# Patient Record
Sex: Male | Born: 1945
Health system: Southern US, Community
[De-identification: ages and names within clinical notes are randomized; demographics above are authoritative.]

## PROBLEM LIST (undated history)

## (undated) DIAGNOSIS — I10 Essential (primary) hypertension: Secondary | ICD-10-CM

## (undated) DIAGNOSIS — F419 Anxiety disorder, unspecified: Secondary | ICD-10-CM

## (undated) DIAGNOSIS — M1990 Unspecified osteoarthritis, unspecified site: Secondary | ICD-10-CM

## (undated) DIAGNOSIS — R7303 Prediabetes: Secondary | ICD-10-CM

## (undated) DIAGNOSIS — Z9289 Personal history of other medical treatment: Secondary | ICD-10-CM

## (undated) DIAGNOSIS — R011 Cardiac murmur, unspecified: Secondary | ICD-10-CM

## (undated) DIAGNOSIS — I2510 Atherosclerotic heart disease of native coronary artery without angina pectoris: Secondary | ICD-10-CM

## (undated) DIAGNOSIS — E785 Hyperlipidemia, unspecified: Secondary | ICD-10-CM

## (undated) DIAGNOSIS — C801 Malignant (primary) neoplasm, unspecified: Secondary | ICD-10-CM

## (undated) DIAGNOSIS — T8859XA Other complications of anesthesia, initial encounter: Secondary | ICD-10-CM

## (undated) DIAGNOSIS — D649 Anemia, unspecified: Secondary | ICD-10-CM

## (undated) HISTORY — PX: WISDOM TOOTH EXTRACTION: SHX21

---

## 2000-11-25 ENCOUNTER — Encounter (INDEPENDENT_AMBULATORY_CARE_PROVIDER_SITE_OTHER): Payer: Self-pay | Admitting: *Deleted

## 2000-11-25 ENCOUNTER — Ambulatory Visit (HOSPITAL_COMMUNITY): Admission: RE | Admit: 2000-11-25 | Discharge: 2000-11-25 | Payer: Self-pay | Admitting: Gastroenterology

## 2004-06-04 ENCOUNTER — Ambulatory Visit: Payer: Self-pay | Admitting: Hematology and Oncology

## 2008-10-24 ENCOUNTER — Encounter: Admission: RE | Admit: 2008-10-24 | Discharge: 2008-10-24 | Payer: Self-pay | Admitting: Family Medicine

## 2010-08-21 NOTE — Procedures (Signed)
[facility]  Patient:    Randy [name], Randy [name] Visit Number: [id number] MRN: [id number]          Service Type: END Location: ENDO Attending Physician:  Nelda Marseille Proc. Date: 11/25/00 Adm. Date:  11/25/2000   CC:         Dellis Anes. Idell Pickles, M.D.   Procedure Report  PROCEDURE:  Colonoscopy with polypectomy.  INDICATIONS:  Family history of colon cancer, due for colonic screening.  INFORMED CONSENT:  Consent was signed after risk, benefits, methods and options were thoroughly discussed in the office.  MEDICINES USED:  Demerol 70 mg, Versed 8 mg.  DESCRIPTION OF PROCEDURE:  Rectal inspection was pertinent for external hemorrhoids.  Digital exam was negative.  The video colonoscope was inserted and easily advanced around the colon to the cecum.  On insertion in the distal transverse, a small polyp was seen and was hot biopsied x 1.  No other abnormalities were seen on insertion.  To advance to the cecum required some minimal abdominal pressure but no position changes.  The cecum was identified by the appendiceal orifice and the ileocecal valve.  Prep was adequate.  There was some liquid stool that required washing and suctioning.  On slow withdrawal through the colon, the cecum, ascending, and majority of the transverse was normal.  Withdrew back to the polypectomy site which had a nice white coagulum and no obvious residual polypoid tissue.  The scope was slowly withdrawn.  There were a few very early left-sided diverticula but no other abnormalities seen as we slowly withdrew back to the rectum.  Once back in the rectum, the scope was retroflexed, pertinent for some tiny internal hemorrhoids.  The scope was straightened and readvanced a short ways up the sigmoid, air was suctioned and the scope removed.  The patient tolerated the procedure well, and there was no obvious immediate complication.  ENDOSCOPIC DIAGNOSES: 1. Internal and external  hemorrhoids. 2. A few very early left-sided diverticula. 3. Tiny distal transverse polyp, hot biopsied. 4. Otherwise within normal limits to the cecum.  PLAN:  Await pathology.  Probably repeat colonoscopy in 3-5 years if this is adenomatous.  Yearly rectals and guaiacs per Dr. Idell Pickles, and I would be happy see back p.r.n.  Otherwise no aspirin or nonsteroidals for one week and Tylenol only. Attending Physician:  Nelda Marseille DD:  11/25/00 TD:  11/26/00 Job: [id number]

## 2011-11-04 HISTORY — PX: CORONARY ANGIOPLASTY WITH STENT PLACEMENT: SHX49

## 2011-12-02 ENCOUNTER — Emergency Department (HOSPITAL_COMMUNITY): Payer: BC Managed Care – PPO

## 2011-12-02 ENCOUNTER — Other Ambulatory Visit: Payer: Self-pay

## 2011-12-02 ENCOUNTER — Encounter (HOSPITAL_COMMUNITY): Payer: Self-pay | Admitting: Emergency Medicine

## 2011-12-02 ENCOUNTER — Encounter (HOSPITAL_COMMUNITY)
Admission: EM | Disposition: A | Discharge status: Home / Self Care | Payer: Self-pay | Source: Home / Self Care | Attending: Internal Medicine

## 2011-12-02 ENCOUNTER — Inpatient Hospital Stay (HOSPITAL_COMMUNITY)
Admission: EM | Admit: 2011-12-02 | Discharge: 2011-12-03 | DRG: 854 | Disposition: A | Discharge status: Home / Self Care | Payer: BC Managed Care – PPO | Attending: Internal Medicine | Admitting: Internal Medicine

## 2011-12-02 ENCOUNTER — Ambulatory Visit (HOSPITAL_COMMUNITY): Admit: 2011-12-02 | Payer: Self-pay | Admitting: Internal Medicine

## 2011-12-02 DIAGNOSIS — I10 Essential (primary) hypertension: Secondary | ICD-10-CM | POA: Diagnosis present

## 2011-12-02 DIAGNOSIS — R072 Precordial pain: Secondary | ICD-10-CM

## 2011-12-02 DIAGNOSIS — Z955 Presence of coronary angioplasty implant and graft: Secondary | ICD-10-CM

## 2011-12-02 DIAGNOSIS — I2510 Atherosclerotic heart disease of native coronary artery without angina pectoris: Principal | ICD-10-CM | POA: Diagnosis present

## 2011-12-02 DIAGNOSIS — I25810 Atherosclerosis of coronary artery bypass graft(s) without angina pectoris: Secondary | ICD-10-CM

## 2011-12-02 DIAGNOSIS — I200 Unstable angina: Secondary | ICD-10-CM | POA: Diagnosis present

## 2011-12-02 DIAGNOSIS — Z7982 Long term (current) use of aspirin: Secondary | ICD-10-CM

## 2011-12-02 DIAGNOSIS — I4949 Other premature depolarization: Secondary | ICD-10-CM

## 2011-12-02 DIAGNOSIS — Z79899 Other long term (current) drug therapy: Secondary | ICD-10-CM

## 2011-12-02 DIAGNOSIS — E785 Hyperlipidemia, unspecified: Secondary | ICD-10-CM | POA: Diagnosis present

## 2011-12-02 DIAGNOSIS — R0789 Other chest pain: Secondary | ICD-10-CM

## 2011-12-02 DIAGNOSIS — I493 Ventricular premature depolarization: Secondary | ICD-10-CM

## 2011-12-02 HISTORY — PX: LEFT HEART CATHETERIZATION WITH CORONARY ANGIOGRAM: SHX5451

## 2011-12-02 HISTORY — DX: Hyperlipidemia, unspecified: E78.5

## 2011-12-02 HISTORY — DX: Atherosclerotic heart disease of native coronary artery without angina pectoris: I25.10

## 2011-12-02 HISTORY — DX: Essential (primary) hypertension: I10

## 2011-12-02 LAB — BASIC METABOLIC PANEL
Chloride: 102 mEq/L (ref 96–112)
Creatinine, Ser: 0.99 mg/dL (ref 0.50–1.35)
GFR calc Af Amer: >90 mL/min (ref >90)
GFR calc Af Amer: 87 mL/min — ABNORMAL LOW (ref >90)
GFR calc non Af Amer: 75 mL/min — ABNORMAL LOW (ref >90)
Potassium: 4.2 mEq/L (ref 3.5–5.1)
Potassium: 4.3 mEq/L (ref 3.5–5.1)
Sodium: 136 mEq/L (ref 135–145)

## 2011-12-02 LAB — TROPONIN I: Troponin I: <0.3 ng/mL (ref <0.30)

## 2011-12-02 LAB — CBC WITH DIFFERENTIAL/PLATELET
Basophils Absolute: 0 10*3/uL (ref 0.0–0.1)
Basophils Relative: 0 % (ref 0–1)
Eosinophils Absolute: 0.1 10*3/uL (ref 0.0–0.7)
Lymphs Abs: 1.7 10*3/uL (ref 0.7–4.0)
MCH: 32.5 pg (ref 26.0–34.0)
MCHC: 35.3 g/dL (ref 30.0–36.0)
Neutrophils Relative %: 64 % (ref 43–77)
Platelets: 150 10*3/uL (ref 150–400)
RBC: 4.06 MIL/uL — ABNORMAL LOW (ref 4.22–5.81)
RDW: 12.6 % (ref 11.5–15.5)

## 2011-12-02 LAB — CBC
MCV: 92.4 fL (ref 78.0–100.0)
Platelets: 152 10*3/uL (ref 150–400)
RDW: 12.6 % (ref 11.5–15.5)
WBC: 4.1 10*3/uL (ref 4.0–10.5)

## 2011-12-02 LAB — POCT ACTIVATED CLOTTING TIME: Activated Clotting Time: 379 seconds

## 2011-12-02 LAB — POCT I-STAT TROPONIN I: Troponin i, poc: 0 ng/mL (ref 0.00–0.08)

## 2011-12-02 LAB — MRSA PCR SCREENING: MRSA by PCR: NEGATIVE

## 2011-12-02 SURGERY — LEFT HEART CATHETERIZATION WITH CORONARY ANGIOGRAM
Anesthesia: LOCAL

## 2011-12-02 MED ORDER — BENAZEPRIL HCL 20 MG PO TABS
20.0000 mg | ORAL_TABLET | Freq: Every day | ORAL | Status: DC
  Administered 2011-12-03: 20 mg via ORAL
  Filled 2011-12-02: qty 1

## 2011-12-02 MED ORDER — SODIUM CHLORIDE 0.9 % IJ SOLN
3.0000 mL | Freq: Two times a day (BID) | INTRAMUSCULAR | Status: DC
  Administered 2011-12-03: 3 mL via INTRAVENOUS

## 2011-12-02 MED ORDER — ADULT MULTIVITAMIN W/MINERALS CH
1.0000 | ORAL_TABLET | Freq: Every morning | ORAL | Status: DC
  Administered 2011-12-03: 1 via ORAL
  Filled 2011-12-02: qty 1

## 2011-12-02 MED ORDER — SODIUM CHLORIDE 0.9 % IV SOLN: 250.0000 mL | INTRAVENOUS | Status: DC | PRN

## 2011-12-02 MED ORDER — SODIUM CHLORIDE 0.9 % IJ SOLN
3.0000 mL | Freq: Two times a day (BID) | INTRAMUSCULAR | Status: DC
  Administered 2011-12-02: 21:00:00 via INTRAVENOUS

## 2011-12-02 MED ORDER — SIMVASTATIN 20 MG PO TABS
20.0000 mg | ORAL_TABLET | Freq: Every day | ORAL | Status: DC
  Administered 2011-12-02: 20 mg via ORAL
  Filled 2011-12-02 (×2): qty 1

## 2011-12-02 MED ORDER — ACETAMINOPHEN 325 MG PO TABS: 650.0000 mg | ORAL_TABLET | ORAL | Status: DC | PRN

## 2011-12-02 MED ORDER — ONDANSETRON HCL 4 MG/2ML IJ SOLN: 4.0000 mg | Freq: Four times a day (QID) | INTRAMUSCULAR | Status: DC | PRN

## 2011-12-02 MED ORDER — SODIUM CHLORIDE 0.9 % IV SOLN
INTRAVENOUS | Status: AC
  Administered 2011-12-02: 21:00:00 via INTRAVENOUS

## 2011-12-02 MED ORDER — FENTANYL CITRATE 0.05 MG/ML IJ SOLN
INTRAMUSCULAR | Status: AC
  Filled 2011-12-02: qty 2

## 2011-12-02 MED ORDER — MIDAZOLAM HCL 2 MG/2ML IJ SOLN
INTRAMUSCULAR | Status: AC
  Filled 2011-12-02: qty 2

## 2011-12-02 MED ORDER — ACETAMINOPHEN 325 MG PO TABS
650.0000 mg | ORAL_TABLET | ORAL | Status: DC | PRN
  Administered 2011-12-02: 650 mg via ORAL
  Filled 2011-12-02: qty 2

## 2011-12-02 MED ORDER — ADENOSINE 12 MG/4ML IV SOLN
16.0000 mL | Freq: Once | INTRAVENOUS | Status: DC
  Filled 2011-12-02: qty 16

## 2011-12-02 MED ORDER — NITROGLYCERIN 0.2 MG/ML ON CALL CATH LAB
INTRAVENOUS | Status: AC
  Filled 2011-12-02: qty 1

## 2011-12-02 MED ORDER — HEPARIN SODIUM (PORCINE) 1000 UNIT/ML IJ SOLN
INTRAMUSCULAR | Status: AC
  Filled 2011-12-02: qty 1

## 2011-12-02 MED ORDER — NITROGLYCERIN 0.4 MG SL SUBL: 0.4000 mg | SUBLINGUAL_TABLET | SUBLINGUAL | Status: DC | PRN

## 2011-12-02 MED ORDER — VERAPAMIL HCL 2.5 MG/ML IV SOLN
INTRAVENOUS | Status: AC
  Filled 2011-12-02: qty 2

## 2011-12-02 MED ORDER — LIDOCAINE HCL (PF) 1 % IJ SOLN
INTRAMUSCULAR | Status: AC
  Filled 2011-12-02: qty 30

## 2011-12-02 MED ORDER — SODIUM CHLORIDE 0.9 % IJ SOLN: 3.0000 mL | INTRAMUSCULAR | Status: DC | PRN

## 2011-12-02 MED ORDER — TICAGRELOR 90 MG PO TABS
ORAL_TABLET | ORAL | Status: AC
  Filled 2011-12-02: qty 2

## 2011-12-02 MED ORDER — HEPARIN (PORCINE) IN NACL 2-0.9 UNIT/ML-% IJ SOLN
INTRAMUSCULAR | Status: AC
  Filled 2011-12-02: qty 2000

## 2011-12-02 MED ORDER — ASPIRIN EC 325 MG PO TBEC
325.0000 mg | DELAYED_RELEASE_TABLET | Freq: Every day | ORAL | Status: DC
  Administered 2011-12-03: 325 mg via ORAL
  Filled 2011-12-02 (×2): qty 1

## 2011-12-02 MED ORDER — BIVALIRUDIN 250 MG IV SOLR
INTRAVENOUS | Status: AC
  Filled 2011-12-02: qty 250

## 2011-12-02 MED ORDER — ZOLPIDEM TARTRATE 5 MG PO TABS
5.0000 mg | ORAL_TABLET | Freq: Every evening | ORAL | Status: DC | PRN
  Administered 2011-12-02: 5 mg via ORAL
  Filled 2011-12-02: qty 1

## 2011-12-02 MED ORDER — METOPROLOL SUCCINATE ER 100 MG PO TB24
100.0000 mg | ORAL_TABLET | Freq: Every day | ORAL | Status: DC
  Administered 2011-12-03: 100 mg via ORAL
  Filled 2011-12-02: qty 1

## 2011-12-02 NOTE — ED Provider Notes (Addendum)
History     CSN: [id number]  Arrival date & time 12/02/11  0913   First MD Initiated Contact with Patient 12/02/11 [phone]      Chief Complaint  Patient presents with  . Chest Pain    (Consider location/radiation/quality/duration/timing/severity/associated sxs/prior treatment) HPI Comments: Patient reports chest discomfort that started this morning after loading golf clubs into his car. He describes the sensation as pressure located substernally. The pressure does not radiate. Patient denies associated nausea, dizziness, diaphoresis, chest pain, SOB. He took 2 aspirin and 1 nitro which seemed to relieved the discomfort. He reports getting this sensation from time to time in which cases he notices his heartrate is low. He has no history of previous MI.   Patient is a 66 y.o. male presenting with chest pain.  Chest Pain     Past Medical History  Diagnosis Date  . Hypertension   . Hyperlipidemia     History reviewed. No pertinent past surgical history.  History reviewed. No pertinent family history.  History  Substance Use Topics  . Smoking status: Never Smoker   . Smokeless tobacco: Not on file  . Alcohol Use: 0.6 oz/week    1 Glasses of wine per week      Review of Systems  Cardiovascular: Positive for chest pain.    Allergies  Codeine  Home Medications   Current Outpatient Rx  Name Route Sig Dispense Refill  . ASPIRIN EC 325 MG PO TBEC Oral Take 325 mg by mouth daily.    [name] BENAZEPRIL HCL PO Oral Take 1 tablet by mouth.    [name] CALCIUM PO Oral Take 1 tablet by mouth daily.    [name] LOVASTATIN 40 MG PO TABS Oral Take 40 mg by mouth every morning.    [name] METOPROLOL SUCCINATE ER 100 MG PO TB24 Oral Take 100 mg by mouth daily. Take with or immediately following a meal.    . ADULT MULTIVITAMIN W/MINERALS CH Oral Take 1 tablet by mouth every morning.      BP 105/62  Pulse 68  Temp 98.1 F (36.7 C) (Oral)  Resp 13  SpO2 95%  Physical Exam  Nursing note and vitals  reviewed. Constitutional: He is oriented to person, place, and time. He appears well-developed and well-nourished. No distress.  HENT:  Head: Normocephalic and atraumatic.  Eyes: Conjunctivae normal and EOM are normal. Pupils are equal, round, and reactive to light. No scleral icterus.  Neck: Normal range of motion. Neck supple. No JVD present.  Cardiovascular: Normal rate and regular rhythm.  Exam reveals no gallop and no friction rub.   No murmur heard. Pulmonary/Chest: Effort normal and breath sounds normal. He has no wheezes. He has no rales. He exhibits no tenderness.  Abdominal: Soft. He exhibits no distension. There is no tenderness. There is no rebound and no guarding.  Musculoskeletal: Normal range of motion.  Neurological: He is alert and oriented to person, place, and time. Coordination normal.       Speech is goal-oriented. Moves limbs without ataxia.   Skin: Skin is warm and dry. He is not diaphoretic.  Psychiatric: He has a normal mood and affect. His behavior is normal.    ED Course  Procedures (including critical care time)  Labs Reviewed  CBC - Abnormal; Notable for the following:    RBC 3.96 (*)     HCT 36.6 (*)     All other components within normal limits  POCT I-STAT TROPONIN I  BASIC METABOLIC PANEL  Dg Chest 2 View  12/02/2011  *RADIOLOGY REPORT*  Clinical Data: Chest pain.  CHEST - 2 VIEW  Comparison: 10/24/2008.  Findings: The heart size and mediastinal contours are normal. The lungs are clear. There is no pleural effusion or pneumothorax. No acute osseous findings are identified.  There is stable spurring at the left first costochondral junction.  IMPRESSION: Stable examination.  No active cardiopulmonary process.   Original Report Authenticated By: Gerrianne Scale, M.D.      1. Midsternal chest pain   2. HTN (hypertension)   3. PVC's (premature ventricular contractions)   4. Intermediate coronary syndrome   5. Stented coronary artery   6.  Hyperlipidemia       MDM  10:30 AM Plan is to rule out cardiac etiology of chest discomfort. If troponin and EKG unremarkable, patient can be discharged with recommended follow up with PCP.   Patient signed out to Dr. Ranae Palms for admission.       Emilia Beck, PA-C 12/02/11 [address] PA-C 02/10/12 1245

## 2011-12-02 NOTE — ED Notes (Signed)
Patient taken to cath lab,  Consent form has been signed,  Wife has belongings

## 2011-12-02 NOTE — CV Procedure (Signed)
Cardiac Cath Procedure Note:  Indication: Chest pain  Procedures performed:  1) Selective coronary angiography 2) Left heart catheterization 3) Left ventriculogram  Description of procedure:   The risks and indication of the procedure were explained. Consent was signed and placed on the chart. An appropriate timeout was taken prior to the procedure. The right groin was prepped and draped in the routine sterile fashion and anesthetized with 1% local lidocaine.   We initially started with a right radial approach. We were able to cannulate the vessel on multiple occasions but we were unable to feed the wire past the antecubital area so we abandoned the approach and went femorally.   A 5 FR arterial sheath was placed in the right femoral artery using a modified Seldinger technique. Standard catheters including a JL4, JR4 and angled pigtail were used. All catheter exchanges were made over a wire.  Complications:  None apparent  Findings:  Ao Pressure: 103/70 (85) LV Pressure: 111/4/14  There was no signficant gradient across the aortic valve on pullback.  Left main: Calcified. 20% ostial lesion  LAD: Large vessel wraps the apex. Very large first diagonal. Small to moderate 2nd diagonal. In LAD, 40% ostial lesion. 30-40% proximal lesion. 60-70% mid. Mild plaquing of first diagonal  LCX: Gives off tiny ramus. Small OM-1. Large OM-2. Proximal to mid LCX calcified with 70-80% tubular lesion.   RCA: Large dominant vessel. Gives off PDA and PL. Prominent bend in proximal portion. 30-40% mid lesion.   LV-gram done in the RAO projection: Ejection fraction = 55% No regional wall motion abnormalities.  Assessment: 1) Moderate 3v-CAD with probable flow-limiting lesion in LCX 2) Normal LV function.   Plan/Discussion:  Images reviewed with Dr. Excell Seltzer. Will plan flow-wire interrogation of LCX and treat remainder of CAD medically.  Daniel Bensimhon 5:15 PM

## 2011-12-02 NOTE — H&P (Signed)
Patient ID: Randy [name] MRN: [id number], DOB/AGE: Sep 16, 1945   Admit date: 12/02/2011   Primary Physician: Gaye Alken, MD Primary Cardiologist: new  Pt. Profile:  66 y/o male w/o prior cardiac hx who presents w/ chest pain.  Problem List  Past Medical History  Diagnosis Date  . Hypertension   . Hyperlipidemia     History reviewed. No pertinent past surgical history.   Allergies  Allergies  Allergen Reactions  . Codeine Other (See Comments)    HPI  66 y/o male with the above problem list.  Since Dec 2012, pt has been having intermittent episodes of upper sternal chest pressure and tightness, without assoc Ss, usually occurring in the late afternoon, while resting, lasting anywhere from 5 mins to 4 hrs.  He is very active @ home, walking 4-5 miles multiple days/week and playing golf on others.  He did have chest pressure while playing 36 holes of golf earlier this year, which lasted all day and later resolved but up until today, that was the only time that Ss occurred with exertion.  Usually following a spell, pt will check his BP and often notes HR readings in the 40's.  He has always felt that his Ss were secondary to the bradycardia.  That said, he has not had presyncope or syncope in the past.  This AM, pt was loading some golf clubs into his car and he developed recurrent chest tightness, this time associated with mild lightheadedness.  He went inside and checked his BP, which was elevated and HR registered in the 40's.  As Ss were worse than usual, pt presented to ED.  Here he was treated with sl NTG with almost immediate relief.  First troponin in nl and ECG is notable for borderline R wave prog and pvc's.  HR's/BP's have been stable.  He is currently pain free.  Home Medications  Prior to Admission medications   Medication Sig Start Date End Date Taking? Authorizing Provider  aspirin EC 325 MG tablet Take 325 mg by mouth daily.   Yes Historical Provider,  MD  BENAZEPRIL HCL PO Take 1 tablet by mouth.   Yes Historical Provider, MD  CALCIUM PO Take 1 tablet by mouth daily.   Yes Historical Provider, MD  lovastatin (MEVACOR) 40 MG tablet Take 40 mg by mouth every morning.   Yes Historical Provider, MD  metoprolol succinate (TOPROL-XL) 100 MG 24 hr tablet Take 100 mg by mouth daily. Take with or immediately following a meal.   Yes Historical Provider, MD  Multiple Vitamin (MULTIVITAMIN WITH MINERALS) TABS Take 1 tablet by mouth every morning.   Yes Historical Provider, MD    Family History  Family History  Problem Relation Age of Onset  . Heart attack Brother     died @ 62  . Diabetes type I Brother     died @ 75  . Coronary artery disease Brother     alive @ 6  . Other Mother     died @ 24 of "heart issues"  . Pneumonia Father     died @ 48    Social History  History   Social History  . Marital Status: Married    Spouse Name: N/A    Number of Children: N/A  . Years of Education: N/A   Occupational History  . Not on file.   Social History Main Topics  . Smoking status: Never Smoker   . Smokeless tobacco: Not on file  . Alcohol Use: 0.6  oz/week    1 Glasses of wine per week     occas etoh  . Drug Use: No  . Sexually Active: Yes   Other Topics Concern  . Not on file   Social History Narrative   Lives in [place] with wife.  Very active, walking 4-5 miles or playing golf most days of the week.     Review of Systems General:  No chills, fever, night sweats or weight changes.  Cardiovascular:  +++ chest pain as outlined above.  No dyspnea on exertion, edema, orthopnea, palpitations, paroxysmal nocturnal dyspnea. Dermatological: No rash, lesions/masses Respiratory: No cough, dyspnea Urologic: No hematuria, dysuria Abdominal:   No nausea, vomiting, diarrhea, bright red blood per rectum, melena, or hematemesis Neurologic:  No visual changes, wkns, changes in mental status. All other systems reviewed and are otherwise  negative except as noted above.  Physical Exam  Blood pressure 139/82, pulse 57, temperature 97.9 F (36.6 C), temperature source Oral, resp. rate 13, SpO2 98.00%.  General: Pleasant, NAD Psych: Normal affect. Neuro: Alert and oriented X 3. Moves all extremities spontaneously. HEENT: Normal  Neck: Supple without bruits or JVD. Lungs:  Resp regular and unlabored, CTA. Heart: RRR no s3, s4, or murmurs. Abdomen: Soft, non-tender, non-distended, BS + x 4.  Extremities: No clubbing, cyanosis or edema. DP/PT/Radials 2+ and equal bilaterally.  Nl reverse Allen's.  Labs  Ti 0.00  Lab Results  Component Value Date   WBC 6.0 12/02/2011   HGB 13.2 12/02/2011   HCT 37.4* 12/02/2011   MCV 92.1 12/02/2011   PLT 150 12/02/2011    Lab 12/02/11 1118  NA 136  K 4.3  CL 102  CO2 28  BUN 18  CREATININE 1.03  CALCIUM 9.4  PROT --  BILITOT --  ALKPHOS --  ALT --  AST --  GLUCOSE 99   Radiology/Studies  Dg Chest 2 View  12/02/2011  *RADIOLOGY REPORT*  Clinical Data: Chest pain.  CHEST - 2 VIEW  Comparison: 10/24/2008.  Findings: The heart size and mediastinal contours are normal. The lungs are clear. There is no pleural effusion or pneumothorax. No acute osseous findings are identified.  There is stable spurring at the left first costochondral junction.  IMPRESSION: Stable examination.  No active cardiopulmonary process.   Original Report Authenticated By: Gerrianne Scale, M.D.    ECG  Rsr, 62, 3 pvc's, poor r prog.  ASSESSMENT AND PLAN  1.  Midsternal Chest Pain: typical/atypical features over the past 9 months.  He has significant FH of CAD and personal RF of htn, hl.  Pain was nitrate responsive today.  Discussed diagnostic options with pt and we will plan to proceed with left heart cardiac catheterization this afternoon.  Cont home meds.  Add asa.  2.  HTN:  Stable.  Consider switching toprol to an alternate agent (norvasc) if bradycardia proves to be an issue.  3.  HL:  Check  lipids/lft's.  Cont statin.   Signed, Nicolasa Ducking, NP 12/02/2011, 3:31 PM  Patient seen and examined independently. Gilford Raid, NP note reviewed carefully - agree with his assessment and plan. I have edited the note based on my findings.   CP concerning for possible [place]. Discussed cardiac CT versus cath. He would like to proceed with cath. We discussed the risks and he is willing to proceed.   He has frequent PVCs and heavy snoring. Will need outpatient sleep study.  Given his activity level would consider switching Toprol to another anti-HTN agent  like amlodipine.  Daniel Bensimhon,MD 4:02 PM

## 2011-12-02 NOTE — ED Notes (Signed)
Pt here with  Chest pain after placing golf clubs in the car . Pt took 2 asa and 1 nitro. Pt is now pain free pt rate of 40 which is norm. Pt has a 20 g in the left hand

## 2011-12-02 NOTE — ED Notes (Signed)
Pt is pain free at this time

## 2011-12-02 NOTE — CV Procedure (Signed)
   CARDIAC CATH NOTE  Name: Randy [name] MRN: [id number] DOB: 1945/09/03  Procedure: PTCA and stenting of the left circumflex, Angio-Seal right femoral artery.  Indication: Unstable angina  Procedural Details: This is a 66 year old gentleman who underwent diagnostic catheterization for unstable angina. He has moderate stenosis of the LAD and right coronary arteries. The left circumflex has a severe lesion in the mid vessel. After careful review of his films, we elected to proceed with pressure wire analysis of the left circumflex. There is an indwelling 5 French sheath in the right femoral artery. This was upsized to a 6 [place]. The patient was loaded with 600 mg of Plavix. Angiomax was used for anticoagulation. Once a therapeutic ACT was achieved a 6 [place] XB 3.5 cm guide catheter was inserted.  A prime wire coronary guidewire was used to cross the lesion.  At baseline, the FFR was 0.98. With intravenous adenosine, the FFR was 0.78. Because the FFR was positive, elected to proceed with PCI. The lesion was predilated with a 2.5 x 15 mm balloon.  The lesion was then stented with a 3.0 x 20 mm Promus Element drug-eluting stent. Following stenting, there was a severe stenosis of the distal stent edge. If coronary nitroglycerin was administered. It was clear that there was a distal stent edge dissection. The dissection was treated with overlapping 2.75 x 16 mm drug-eluting stent deployed at 12 atmospheres. The stent was postdilated with a 3.0 x 20 mm noncompliant balloon.  Following PCI, there was 0% residual stenosis and TIMI-3 flow. Final angiography confirmed an excellent result. The patient tolerated the procedure well. There were no immediate procedural complications. Femoral hemostasis was achieved with an Angio-Seal device. The patient was transferred to the post catheterization recovery area for further monitoring.  Lesion Data: Vessel: Left circumflex/mid Percent stenosis (pre): 80 TIMI-flow  (pre):  3 Stent:  3.0 x 20 mm drug-eluting Percent stenosis (post): 0 TIMI-flow (post): 3  Conclusions: Successful pressure wire directed PCI of the left circumflex  Recommendations: Dual antiplatelet therapy with aspirin and Plavix for a minimum of 12 months  Tonny Bollman 12/02/2011, 6:35 PM

## 2011-12-03 ENCOUNTER — Encounter (HOSPITAL_COMMUNITY): Payer: Self-pay | Admitting: Nurse Practitioner

## 2011-12-03 DIAGNOSIS — I2510 Atherosclerotic heart disease of native coronary artery without angina pectoris: Secondary | ICD-10-CM

## 2011-12-03 DIAGNOSIS — I25810 Atherosclerosis of coronary artery bypass graft(s) without angina pectoris: Secondary | ICD-10-CM

## 2011-12-03 DIAGNOSIS — I200 Unstable angina: Secondary | ICD-10-CM

## 2011-12-03 DIAGNOSIS — E785 Hyperlipidemia, unspecified: Secondary | ICD-10-CM

## 2011-12-03 LAB — TROPONIN I: Troponin I: <0.3 ng/mL (ref <0.30)

## 2011-12-03 LAB — CBC
HCT: 36.2 % — ABNORMAL LOW (ref 39.0–52.0)
Hemoglobin: 12.9 g/dL — ABNORMAL LOW (ref 13.0–17.0)
MCHC: 35.6 g/dL (ref 30.0–36.0)
RBC: 3.92 MIL/uL — ABNORMAL LOW (ref 4.22–5.81)

## 2011-12-03 LAB — LIPID PANEL
Cholesterol: 146 mg/dL (ref 0–200)
Total CHOL/HDL Ratio: 3.3 RATIO

## 2011-12-03 LAB — BASIC METABOLIC PANEL
BUN: 14 mg/dL (ref 6–23)
CO2: 25 mEq/L (ref 19–32)
Glucose, Bld: 97 mg/dL (ref 70–99)
Potassium: 4 mEq/L (ref 3.5–5.1)
Sodium: 135 mEq/L (ref 135–145)

## 2011-12-03 LAB — HEMOGLOBIN A1C
Hgb A1c MFr Bld: 5.8 % — ABNORMAL HIGH (ref <5.7)
Mean Plasma Glucose: 120 mg/dL — ABNORMAL HIGH (ref <117)

## 2011-12-03 MED ORDER — NITROGLYCERIN 0.4 MG SL SUBL: 0.4000 mg | SUBLINGUAL_TABLET | SUBLINGUAL | Status: DC | PRN

## 2011-12-03 MED ORDER — TICAGRELOR 90 MG PO TABS
90.0000 mg | ORAL_TABLET | Freq: Two times a day (BID) | ORAL | Status: DC
  Administered 2011-12-03: 90 mg via ORAL
  Filled 2011-12-03 (×2): qty 1

## 2011-12-03 MED ORDER — ASPIRIN EC 81 MG PO TBEC: 81.0000 mg | DELAYED_RELEASE_TABLET | Freq: Every day | ORAL | Status: DC

## 2011-12-03 MED ORDER — ATORVASTATIN CALCIUM 40 MG PO TABS: 40.0000 mg | ORAL_TABLET | Freq: Every day | ORAL | Status: DC

## 2011-12-03 MED ORDER — TICAGRELOR 90 MG PO TABS: 90.0000 mg | ORAL_TABLET | Freq: Two times a day (BID) | ORAL | Status: DC

## 2011-12-03 MED FILL — Dextrose Inj 5%: INTRAVENOUS | Qty: 50 | Status: AC

## 2011-12-03 NOTE — ED Provider Notes (Signed)
Medical screening examination/treatment/procedure(s) were conducted as a shared visit with non-physician practitioner(s) and myself.  I personally evaluated the patient during the encounter   Kista Robb, MD 12/03/11 0725 

## 2011-12-03 NOTE — H&P (Deleted)
Patient ID: Randy [name],  MRN: [id number], DOB/AGE: 12/22/1945 66 y.o.  Admit date: 12/02/2011 Discharge date: 12/03/2011  Primary Care Provider: Gaye Alken Primary Cardiologist: Judie Petit. Excell Seltzer, MD  Discharge Diagnoses Principal Problem:  *Midsternal chest pain Active Problems:  CAD (coronary artery disease) of artery bypass graft  HTN (hypertension)  Hyperlipidemia  Allergies Allergies  Allergen Reactions  . Codeine Other (See Comments)   Procedures  Cardiac Catheterization and Percutaneous Coronary Intervention 12/02/2011  Findings:  Ao Pressure: 103/70 (85) LV Pressure: 111/4/14  There was no signficant gradient across the aortic valve on pullback.  Left main: Calcified. 20% ostial lesion LAD: Large vessel wraps the apex. Very large first diagonal. Small to moderate 2nd diagonal. In LAD, 40% ostial lesion. 30-40% proximal lesion. 60-70% mid. Mild plaquing of first diagonal LCX: Gives off tiny ramus. Small OM-1. Large OM-2. Proximal to mid LCX calcified with 70-80% tubular lesion.    **FFR was performed on the LCX lesion and was 0.98 @ baseline and 0.78 after adenosine injection.  The LCX was subsequently stented with a 3.0 x 20mm Promus DES with distal stent edge dissection treated with a 2.75 x 16 mm Promus DES.  RCA: Large dominant vessel. Gives off PDA and PL. Prominent bend in proximal portion. 30-40% mid lesion.   LV-gram done in the RAO projection: Ejection fraction = 55% No regional wall motion abnormalities. _____________  History of Present Illness  66 year old male without prior history of coronary artery disease who was in his usual state of health until December of 2012, when he began to experience intermittent upper sternal chest pressure and tightness without associated symptoms, lasting anywhere between 5 minutes and 4 hours and resolving spontaneously. He had only ever had symptoms once during activity. On the day of admission, while loading some  golf clubs into his car, he developed recurrent chest tightness associated with mild lightheadedness. He went inside and took his blood pressure which he noted to be elevated heart rates in the 40s. He has seen similar heart rates in the past. He presented to the [place] where ECG was notable only for borderline R-wave progression and PVCs. Troponin was normal. He was admitted for further evaluation.  Hospital Course  Following admission, decision was made to pursue diagnostic catheterization given his significant family history of coronary artery disease. Diagnostic catheterization revealed a moderate multivessel coronary artery disease with a significant 70-80% tubular stenosis in the proximal and mid left circumflex. Films were reviewed and decision was made to perform fractional flow reserve within the left circumflex. This showed an FFR of 0.78, which was felt to be significant. The left circumflex was subsequently stented with 2 Promus drug-eluting stents as outlined above. Patient tolerated procedure well and post procedure has been doing without recurrent symptoms or limitations. He will be discharged home today in good condition.  Discharge Vitals Blood pressure 144/83, pulse 51, temperature 97.7 F (36.5 C), temperature source Oral, resp. rate 10, height 5\' 7"  (1.702 m), weight 196 lb 13.9 oz (89.3 kg), SpO2 97.00%.  Filed Weights   12/02/11 1916 12/02/11 2018 12/03/11 0500  Weight: 199 lb 11.8 oz (90.6 kg) 199 lb 11.8 oz (90.6 kg) 196 lb 13.9 oz (89.3 kg)   Labs  CBC  Basename 12/03/11 0500 12/02/11 1118  WBC 6.8 6.0  NEUTROABS -- 3.9  HGB 12.9* 13.2  HCT 36.2* 37.4*  MCV 92.3 92.1  PLT 142* 150   Basic Metabolic Panel  Basename 12/03/11 0500 12/02/11 1118  NA 135 136  K 4.0 4.3  CL 101 102  CO2 25 28  GLUCOSE 97 99  BUN 14 18  CREATININE 0.96 1.03  CALCIUM 8.9 9.4  MG -- --  PHOS -- --   Cardiac Enzymes  Basename 12/03/11 0105 12/02/11 1953  CKTOTAL -- --  CKMB  -- --  CKMBINDEX -- --  TROPONINI <0.30 <0.30   Hemoglobin A1C  Basename 12/02/11 1953  HGBA1C 5.8*   Fasting Lipid Panel  Basename 12/03/11 0500  CHOL 146  HDL 44  LDLCALC 79  TRIG 114  CHOLHDL 3.3  LDLDIRECT --   Thyroid Function Tests  Basename 12/02/11 1953  TSH 1.784  T4TOTAL --  T3FREE --  THYROIDAB --   Disposition  Pt is being discharged home today in good condition.  Follow-up Plans & Appointments  Follow-up Information    Follow up with Tereso Newcomer, PA on 12/17/2011. (10:10 AM - Dr. Earmon Phoenix PA)    Contact information:   [address] [place] [address] [phone]       Follow up with Gaye Alken, MD. (as scheduled.)    Contact information:   [address] [place] [address] [phone]        Discharge Medications  Medication List  As of 12/03/2011 10:06 AM   STOP taking these medications         lovastatin 40 MG tablet         TAKE these medications         aspirin EC 81 MG tablet   Take 1 tablet (81 mg total) by mouth daily.      atorvastatin 40 MG tablet   Commonly known as: LIPITOR   Take 1 tablet (40 mg total) by mouth daily.      BENAZEPRIL HCL PO   Take 1 tablet by mouth.      CALCIUM PO   Take 1 tablet by mouth daily.      metoprolol succinate 100 MG 24 hr tablet   Commonly known as: TOPROL-XL   Take 100 mg by mouth daily. Take with or immediately following a meal.      multivitamin with minerals Tabs   Take 1 tablet by mouth every morning.      nitroGLYCERIN 0.4 MG SL tablet   Commonly known as: NITROSTAT   Place 1 tablet (0.4 mg total) under the tongue every 5 (five) minutes x 3 doses as needed for chest pain.      Ticagrelor 90 MG Tabs tablet   Commonly known as: BRILINTA   Take 1 tablet (90 mg total) by mouth 2 (two) times daily.          Outstanding Labs/Studies  Follow up lipids/lft's in 12 wks (change of statin)  Duration of  Discharge Encounter   Greater than 30 minutes including physician time.  Signed, Nicolasa Ducking NP 12/03/2011, 10:06 AM

## 2011-12-03 NOTE — Care Management Note (Signed)
    Page 1 of 1   12/03/2011     11:15:39 AM   CARE MANAGEMENT NOTE 12/03/2011  Patient:  Randy [name], Randy [name]   Account Number:  [id number]  Date Initiated:  12/03/2011  Documentation initiated by:  Junius Creamer  Subjective/Objective Assessment:   adm w ch pain, staged intervention     Action/Plan:   lives w wife, pcp dr Zachery Dauer   Anticipated DC Date:  12/03/2011   Anticipated DC Plan:  HOME/SELF CARE      DC Planning Services  CM consult      Choice offered to / List presented to:             Status of service:   Medicare Important Message given?   (If response is "NO", the following Medicare IM given date fields will be blank) Date Medicare IM given:   Date Additional Medicare IM given:    Discharge Disposition:  HOME/SELF CARE  Per UR Regulation:  Reviewed for med. necessity/level of care/duration of stay  If discussed at Long Length of Stay Meetings, dates discussed:    Comments:  8/30 11:15a debbie Zineb Glade rn,bsn pt was disch prior to being seen by cm. nse did give pt brilinta 30days free card and card that may assist w copays. pt states  to nse he had ins.

## 2011-12-03 NOTE — Progress Notes (Signed)
CARDIAC REHAB PHASE I   PRE:  Rate/Rhythm: 71SR  BP:  Supine:   Sitting: 147/85  Standing:    SaO2:   MODE:  Ambulation: 700 ft   POST:  Rate/Rhythem: 86SR  BP:  Supine:   Sitting: 162/83  Standing:    SaO2:  1000-1057 Pt walked 700 ft with steady gait. Tolerated well. Denied chest pain. Education completed with pt and wife. Permission given to refer to [facility] Phase 2.  Pt ready to go home.  Duanne Limerick

## 2011-12-03 NOTE — Progress Notes (Signed)
    Subjective:  No chest pain or dyspnea. Feels well.  Objective:  Vital Signs in the last 24 hours: Temp:  [97.5 F (36.4 C)-98.3 F (36.8 C)] 97.7 F (36.5 C) (08/30 0727) Pulse Rate:  [49-68] 51  (08/30 0727) Resp:  [7-29] 10  (08/29 2300) BP: (97-144)/(62-83) 144/83 mmHg (08/30 0723) SpO2:  [95 %-100 %] 97 % (08/30 0727) Weight:  [89.3 kg (196 lb 13.9 oz)-90.6 kg (199 lb 11.8 oz)] 89.3 kg (196 lb 13.9 oz) (08/30 0500)  Intake/Output from previous day: 08/29 0701 - 08/30 0700 In: 1860 [P.O.:960; I.V.:900] Out: 500 [Urine:500]  Physical Exam: Pt is alert and oriented, NAD HEENT: normal Neck: JVP - normal, carotids 2+= without bruits Lungs: CTA bilaterally CV: RRR without murmur or gallop Abd: soft, NT, Positive BS, no hepatomegaly Ext: no C/C/E, distal pulses intact and equal. Right groin clear. Skin: warm/dry no rash   Lab Results:  Basename 12/03/11 0500 12/02/11 1118  WBC 6.8 6.0  HGB 12.9* 13.2  PLT 142* 150    Basename 12/03/11 0500 12/02/11 1118  NA 135 136  K 4.0 4.3  CL 101 102  CO2 25 28  GLUCOSE 97 99  BUN 14 18  CREATININE 0.96 1.03    Basename 12/03/11 0105 12/02/11 1953  TROPONINI <0.30 <0.30   Tele: sinus rhythm. No arrhythmia. Personally reviewed.  Assessment/Plan:  1. Unstable angina. S/P PCI of the Lcx with a DES platforms (overlapping stents). ASA 81 mg and brilinta 90 mg BID for one year uninterrupted.  2. HTN - continue benazepril and toprol XL home doses  3. Hyperlipidemia - considering plaque burden and ACS presentation, would switch to atorvastatin 40 mg with lipids and LFT's in 12 weeks.   4. Dispo - home today. F/U Tereso Newcomer in 2 weeks. I will see long-term.  Tonny Bollman, M.D. 12/03/2011, 7:58 AM

## 2011-12-08 NOTE — Discharge Summary (Signed)
Patient ID: Randy [name],  MRN: [id number], DOB/AGE: 11/29/1945 66 y.o.  Admit date: 12/02/2011 Discharge date: 12/03/2011  Primary Care Provider: BARNES,ELIZABETH STEWART Primary Cardiologist: M. Cooper, MD  Discharge Diagnoses Principal Problem:  *Midsternal chest pain Active Problems:  CAD (coronary artery disease) of artery bypass graft  HTN (hypertension)  Hyperlipidemia  Allergies Allergies  Allergen Reactions  . Codeine Other (See Comments)   Procedures  Cardiac Catheterization and Percutaneous Coronary Intervention 12/02/2011  Findings:  Ao Pressure: 103/70 (85) LV Pressure: 111/4/14  There was no signficant gradient across the aortic valve on pullback.  Left main: Calcified. 20% ostial lesion LAD: Large vessel wraps the apex. Very large first diagonal. Small to moderate 2nd diagonal. In LAD, 40% ostial lesion. 30-40% proximal lesion. 60-70% mid. Mild plaquing of first diagonal LCX: Gives off tiny ramus. Small OM-1. Large OM-2. Proximal to mid LCX calcified with 70-80% tubular lesion.    **FFR was performed on the LCX lesion and was 0.98 @ baseline and 0.78 after adenosine injection.  The LCX was subsequently stented with a 3.0 x 20mm Promus DES with distal stent edge dissection treated with a 2.75 x 16 mm Promus DES.  RCA: Large dominant vessel. Gives off PDA and PL. Prominent bend in proximal portion. 30-40% mid lesion.   LV-gram done in the RAO projection: Ejection fraction = 55% No regional wall motion abnormalities. _____________  History of Present Illness  66-year-old male without prior history of coronary artery disease who was in his usual state of health until December of 2012, when he began to experience intermittent upper sternal chest pressure and tightness without associated symptoms, lasting anywhere between 5 minutes and 4 hours and resolving spontaneously. He had only ever had symptoms once during activity. On the day of admission, while loading some  golf clubs into his car, he developed recurrent chest tightness associated with mild lightheadedness. He went inside and took his blood pressure which he noted to be elevated heart rates in the 40s. He has seen similar heart rates in the past. He presented to the [facility] where ECG was notable only for borderline R-wave progression and PVCs. Troponin was normal. He was admitted for further evaluation.  Hospital Course  Following admission, decision was made to pursue diagnostic catheterization given his significant family history of coronary artery disease. Diagnostic catheterization revealed a moderate multivessel coronary artery disease with a significant 70-80% tubular stenosis in the proximal and mid left circumflex. Films were reviewed and decision was made to perform fractional flow reserve within the left circumflex. This showed an FFR of 0.78, which was felt to be significant. The left circumflex was subsequently stented with 2 Promus drug-eluting stents as outlined above. Patient tolerated procedure well and post procedure has been doing without recurrent symptoms or limitations. He will be discharged home today in good condition.  Discharge Vitals Blood pressure 144/83, pulse 51, temperature 97.7 F (36.5 C), temperature source Oral, resp. rate 10, height 5' 7" (1.702 m), weight 196 lb 13.9 oz (89.3 kg), SpO2 97.00%.  Filed Weights   12/02/11 1916 12/02/11 2018 12/03/11 0500  Weight: 199 lb 11.8 oz (90.6 kg) 199 lb 11.8 oz (90.6 kg) 196 lb 13.9 oz (89.3 kg)   Labs  CBC  Basename 12/03/11 0500 12/02/11 1118  WBC 6.8 6.0  NEUTROABS -- 3.9  HGB 12.9* 13.2  HCT 36.2* 37.4*  MCV 92.3 92.1  PLT 142* 150   Basic Metabolic Panel  Basename 12/03/11 0500 12/02/11 1118  NA 135 136    K 4.0 4.3  CL 101 102  CO2 25 28  GLUCOSE 97 99  BUN 14 18  CREATININE 0.96 1.03  CALCIUM 8.9 9.4  MG -- --  PHOS -- --   Cardiac Enzymes  Basename 12/03/11 0105 12/02/11 1953  CKTOTAL -- --  CKMB  -- --  CKMBINDEX -- --  TROPONINI <0.30 <0.30   Hemoglobin A1C  Basename 12/02/11 1953  HGBA1C 5.8*   Fasting Lipid Panel  Basename 12/03/11 0500  CHOL 146  HDL 44  LDLCALC 79  TRIG 114  CHOLHDL 3.3  LDLDIRECT --   Thyroid Function Tests  Basename 12/02/11 1953  TSH 1.784  T4TOTAL --  T3FREE --  THYROIDAB --   Disposition  Pt is being discharged home today in good condition.  Follow-up Plans & Appointments  Follow-up Information    Follow up with Scott Weaver, PA on 12/17/2011. (10:10 AM - Dr. Cooper's PA)    Contact information:   [address] [place] [address] [phone]       Follow up with BARNES,ELIZABETH STEWART, MD. (as scheduled.)    Contact information:   [address] [place] [address] [phone]        Discharge Medications  Medication List  As of 12/03/2011 10:06 AM   STOP taking these medications         lovastatin 40 MG tablet         TAKE these medications         aspirin EC 81 MG tablet   Take 1 tablet (81 mg total) by mouth daily.      atorvastatin 40 MG tablet   Commonly known as: LIPITOR   Take 1 tablet (40 mg total) by mouth daily.      BENAZEPRIL HCL PO   Take 1 tablet by mouth.      CALCIUM PO   Take 1 tablet by mouth daily.      metoprolol succinate 100 MG 24 hr tablet   Commonly known as: TOPROL-XL   Take 100 mg by mouth daily. Take with or immediately following a meal.      multivitamin with minerals Tabs   Take 1 tablet by mouth every morning.      nitroGLYCERIN 0.4 MG SL tablet   Commonly known as: NITROSTAT   Place 1 tablet (0.4 mg total) under the tongue every 5 (five) minutes x 3 doses as needed for chest pain.      Ticagrelor 90 MG Tabs tablet   Commonly known as: BRILINTA   Take 1 tablet (90 mg total) by mouth 2 (two) times daily.          Outstanding Labs/Studies  Follow up lipids/lft's in 12 wks (change of statin)  Duration of  Discharge Encounter   Greater than 30 minutes including physician time.  Signed, Zaray Gatchel NP 12/03/2011, 10:06 AM      mg total) under the tongue every 5 (five) minutes x 3 doses as needed for chest pain.           Ticagrelor 90 MG Tabs tablet      Commonly known as: BRILINTA     Take 1 tablet (90 mg total) by mouth 2 (two) times daily.    Outstanding Labs/Studies   Follow up lipids/lft's in 12 wks (change of statin)   Duration of Discharge Encounter     Greater than 30 minutes including physician time.   Signed, Nicolasa Ducking NP 12/03/2011, 10:06 AM

## 2011-12-17 ENCOUNTER — Encounter: Payer: Self-pay | Admitting: Physician Assistant

## 2011-12-17 ENCOUNTER — Ambulatory Visit (INDEPENDENT_AMBULATORY_CARE_PROVIDER_SITE_OTHER): Payer: BC Managed Care – PPO | Admitting: Physician Assistant

## 2011-12-17 VITALS — BP 130/78 | HR 63 | Ht 66.5 in | Wt 195.8 lb

## 2011-12-17 DIAGNOSIS — I2510 Atherosclerotic heart disease of native coronary artery without angina pectoris: Secondary | ICD-10-CM

## 2011-12-17 DIAGNOSIS — E785 Hyperlipidemia, unspecified: Secondary | ICD-10-CM

## 2011-12-17 DIAGNOSIS — I10 Essential (primary) hypertension: Secondary | ICD-10-CM

## 2011-12-17 MED ORDER — TICAGRELOR 90 MG PO TABS: 90.0000 mg | ORAL_TABLET | Freq: Two times a day (BID) | ORAL | Status: DC

## 2011-12-17 MED ORDER — ATORVASTATIN CALCIUM 40 MG PO TABS: 40.0000 mg | ORAL_TABLET | Freq: Every day | ORAL | Status: DC

## 2011-12-17 NOTE — Patient Instructions (Addendum)
Your physician recommends that you return for lab work in: 02/03/12 FOR FASTING LIPID AND LIVER PANEL 272.4   Your physician recommends that you schedule a follow-up appointment in: 03/06/12 @ 8:45 AM WITH DR. Excell Seltzer  RX SENT INTO TO [facility] [place]

## 2011-12-17 NOTE — Progress Notes (Signed)
[address] [place], [place]  [address] Phone: [phone] Fax:  [phone]  Date:  12/17/2011   Name:  Randy [name]   DOB:  [birth year]   MRN:  [id number]  PCP:  Gaye Alken, MD  Primary Cardiologist:  Dr. Tonny Bollman  Primary Electrophysiologist:  None    History of Present Illness: Randy [name] is a 66 y.o. male who returns for post hospital follow up.  He has a hx of HTN, HL.  Admitted 8/29-8/30 with unstable angina.  MI was ruled out by enzymes.  LHC demonstrated mid LAD 60-70% and prox to mid CFX 70-80% and mild to mod disease elsewhere.  EF 55%.  CFX was thought to be flow limiting.  FFR was abnormal and he underwent PCI with Promus DES x 2 to CFX.  LAD treated medically.  He is doing well.  The patient denies chest pain, shortness of breath, syncope, orthopnea, PND or significant pedal edema.    Wt Readings from Last 3 Encounters:  12/17/11 195 lb 12.8 oz (88.814 kg)  12/03/11 196 lb 13.9 oz (89.3 kg)  12/03/11 196 lb 13.9 oz (89.3 kg)     Labs (8/13):  K 4, creatinine 0.96, HDL 44, LDL 79, Hgb 12.9, PLT 142, TSH 1.784   Past Medical History  Diagnosis Date  . Hypertension   . Hyperlipidemia   . CAD (coronary artery disease)     a. 11/2011 Cath: LM 20 ost, LAD 40 ost, 30-40p, 60-54m, D1 mild plaquing, LCX 70-80 p-m (FFR 0.78, treated with  3.0x20 and 2.75x16 Promus DES), RCA 30-38m, EF 55%, no rwma.    Current Outpatient Prescriptions  Medication Sig Dispense Refill  . aspirin EC 81 MG tablet Take 1 tablet (81 mg total) by mouth daily.      [name] atorvastatin (LIPITOR) 40 MG tablet Take 1 tablet (40 mg total) by mouth daily.  30 tablet  6  . BENAZEPRIL HCL PO Take 1 tablet by mouth.      [name] CALCIUM PO Take 1 tablet by mouth daily.      . metoprolol succinate (TOPROL-XL) 100 MG 24 hr tablet Take 100 mg by mouth daily.       . Multiple Vitamin (MULTIVITAMIN WITH MINERALS) TABS Take 1 tablet by mouth every morning.      .  nitroGLYCERIN (NITROSTAT) 0.4 MG SL tablet Place 1 tablet (0.4 mg total) under the tongue every 5 (five) minutes x 3 doses as needed for chest pain.  25 tablet  6  . Ticagrelor (BRILINTA) 90 MG TABS tablet Take 1 tablet (90 mg total) by mouth 2 (two) times daily.  60 tablet  6  . zolpidem (AMBIEN) 10 MG tablet Take 10 mg by mouth at bedtime as needed.         Allergies: Allergies  Allergen Reactions  . Codeine Other (See Comments)    History  Substance Use Topics  . Smoking status: Never Smoker   . Smokeless tobacco: Not on file  . Alcohol Use: 0.6 oz/week    1 Glasses of wine per week     occas etoh      PHYSICAL EXAM: VS:  BP 130/78  Pulse 63  Ht 5' 6.5" (1.689 m)  Wt 195 lb 12.8 oz (88.814 kg)  BMI 31.13 kg/m2 Well nourished, well developed, in no acute distress HEENT: normal Neck: no JVD Cardiac:  normal S1, S2; RRR; no murmur Lungs:  clear to auscultation bilaterally, no wheezing,  rhonchi or rales Abd: soft, nontender, no hepatomegaly Ext: no edema; right wrist without hematoma or bruit; right groin without hematoma or bruit  Skin: warm and dry Neuro:  CNs 2-12 intact, no focal abnormalities noted  EKG:  NSR, HR 63, normal axis, no acute changes      ASSESSMENT AND PLAN:  1. Coronary Artery Disease:  Doing well post PCI.  We discussed the importance of dual antiplatelet therapy. Continue current Rx.  He is not interested in cardiac rehab.  Follow up with Dr. Tonny Bollman in 2-3 mos.  2. Hypertension:  Controlled.  Continue current therapy.   3. Hyperlipidemia:  Check Lipids and LFTs in 6 weeks.  Patient would like to know if he can enroll in the ACCELERATE trial (CETP inhibitors).  I will review this with Dr. Tonny Bollman. If he agrees, we can get the research team to call him.  Signed, Tereso Newcomer, PA-C  10:31 AM 12/17/2011

## 2012-01-04 ENCOUNTER — Institutional Professional Consult (permissible substitution): Payer: Self-pay | Admitting: Cardiovascular Disease

## 2012-02-02 ENCOUNTER — Other Ambulatory Visit (INDEPENDENT_AMBULATORY_CARE_PROVIDER_SITE_OTHER): Payer: BC Managed Care – PPO

## 2012-02-02 ENCOUNTER — Telehealth: Payer: Self-pay | Admitting: *Deleted

## 2012-02-02 DIAGNOSIS — E785 Hyperlipidemia, unspecified: Secondary | ICD-10-CM

## 2012-02-02 DIAGNOSIS — I2510 Atherosclerotic heart disease of native coronary artery without angina pectoris: Secondary | ICD-10-CM

## 2012-02-02 LAB — LIPID PANEL
Total CHOL/HDL Ratio: 3
Triglycerides: 55 mg/dL (ref 0.0–149.0)

## 2012-02-02 LAB — HEPATIC FUNCTION PANEL
ALT: 19 U/L (ref 0–53)
AST: 19 U/L (ref 0–37)
Bilirubin, Direct: 0 mg/dL (ref 0.0–0.3)
Total Protein: 6.3 g/dL (ref 6.0–8.3)

## 2012-02-02 NOTE — Telephone Encounter (Signed)
Message copied by Tarri Fuller on Wed Feb 02, 2012  5:11 PM ------      Message from: [place], [place] T      Created: Wed Feb 02, 2012  1:39 PM       Lipids look good      Continue with current treatment plan.      Tereso Newcomer, PA-C  1:39 PM 02/02/2012

## 2012-02-02 NOTE — Telephone Encounter (Signed)
lmom labs good 

## 2012-02-03 ENCOUNTER — Other Ambulatory Visit: Payer: BC Managed Care – PPO

## 2012-02-12 NOTE — ED Provider Notes (Signed)
Medical screening examination/treatment/procedure(s) were conducted as a shared visit with non-physician practitioner(s) and myself.  I personally evaluated the patient during the encounter   Loren Racer, MD 02/12/12 [phone]

## 2012-03-06 ENCOUNTER — Encounter: Payer: Self-pay | Admitting: Cardiovascular Disease

## 2012-03-06 ENCOUNTER — Ambulatory Visit (INDEPENDENT_AMBULATORY_CARE_PROVIDER_SITE_OTHER): Payer: BC Managed Care – PPO | Admitting: Cardiovascular Disease

## 2012-03-06 VITALS — BP 142/86 | HR 59 | Resp 18 | Ht 67.0 in | Wt 192.0 lb

## 2012-03-06 DIAGNOSIS — E785 Hyperlipidemia, unspecified: Secondary | ICD-10-CM

## 2012-03-06 NOTE — Patient Instructions (Addendum)
**Note De-Identified Randy [name] Obfuscation** Your physician recommends that you return for lab work in: 6 month (just before next office visit)  Your physician recommends that you continue on your current medications as directed. Please refer to the Current Medication list given to you today. Your physician wants you to follow-up in: 6 months. You will receive a reminder letter in the mail two months in advance. If you don't receive a letter, please call our office to schedule the follow-up appointment.

## 2012-03-06 NOTE — Progress Notes (Signed)
HPI:  66 year old gentleman presenting for followup evaluation. The patient has coronary artery disease and he presented in August of 2013 with unstable angina. He was found to have significant disease in the left circumflex and he underwent PCI with overlapping drug-eluting stents guided by FFR. He had moderate mid LAD stenosis and medical therapy was recommended. The patient's left ventricular ejection fraction was preserved at 55%. Lipids were checked October 30 with a cholesterol of 103, triglycerides 55, HDL 39, and LDL 53. His LFTs were within normal limits.  Overall the patient is doing well. He has some muscle soreness after golfing and he wonders about whether he can take nonsteroidal anti-inflammatories. He has not had any chest pain or pressure, dyspnea, edema, or palpitations. He's been compliant with his medications. He exercises regularly but does not follow a strict diet.  Outpatient Encounter Prescriptions as of 03/06/2012  Medication Sig Dispense Refill  . aspirin EC 81 MG tablet Take 1 tablet (81 mg total) by mouth daily.      [name] atorvastatin (LIPITOR) 40 MG tablet Take 1 tablet (40 mg total) by mouth daily.  90 tablet  3  . BENAZEPRIL HCL PO Take 1 tablet by mouth.      [name] CALCIUM PO Take 1 tablet by mouth daily.      . metoprolol succinate (TOPROL-XL) 100 MG 24 hr tablet Take 100 mg by mouth daily.       . Multiple Vitamin (MULTIVITAMIN WITH MINERALS) TABS Take 1 tablet by mouth every morning.      . nitroGLYCERIN (NITROSTAT) 0.4 MG SL tablet Place 1 tablet (0.4 mg total) under the tongue every 5 (five) minutes x 3 doses as needed for chest pain.  25 tablet  6  . Ticagrelor (BRILINTA) 90 MG TABS tablet Take 1 tablet (90 mg total) by mouth 2 (two) times daily.  240 tablet  3  . zolpidem (AMBIEN) 10 MG tablet Take 10 mg by mouth at bedtime as needed.         Allergies  Allergen Reactions  . Codeine Other (See Comments)    Past Medical History  Diagnosis Date  . Hypertension    . Hyperlipidemia   . CAD (coronary artery disease)     a. 11/2011 Cath: LM 20 ost, LAD 40 ost, 30-40p, 60-39m, D1 mild plaquing, LCX 70-80 p-m (FFR 0.78, treated with  3.0x20 and 2.75x16 Promus DES), RCA 30-55m, EF 55%, no rwma.    ROS: Negative except as per HPI  BP 142/86  Pulse 59  Resp 18  Ht 5\' 7"  (1.702 m)  Wt 87.091 kg (192 lb)  BMI 30.07 kg/m2  SpO2 98%  PHYSICAL EXAM: Pt is alert and oriented, NAD HEENT: normal Neck: JVP - normal, carotids 2+= without bruits Lungs: CTA bilaterally CV: RRR without murmur or gallop Abd: soft, NT, Positive BS, no hepatomegaly Ext: no C/C/E, distal pulses intact and equal Skin: warm/dry no rash  ASSESSMENT AND PLAN: 1. Coronary artery disease, native vessel. The patient is stable without anginal symptoms. He remains on dual antiplatelet therapy with aspirin and brilinta and should continue this for 12 months from the time of his presentation which was taken to August 2014. He is on a statin drug and beta blocker as well. I will see him back in followup in 6 months.  2. Essential hypertension. Home blood pressures were reviewed and they are well controlled. He will remain on metoprolol succinate 100 mg daily.  3. Hyperlipidemia. The patient is a  high intensity statin therapy with atorvastatin 40 mg. His lipids and LFTs were reviewed and these will be repeated before his followup visit in 6 months.  Tonny Bollman 03/06/2012 9:02 AM

## 2012-05-20 ENCOUNTER — Other Ambulatory Visit: Payer: Self-pay

## 2012-06-30 ENCOUNTER — Other Ambulatory Visit: Payer: Self-pay | Admitting: *Deleted

## 2012-06-30 DIAGNOSIS — E785 Hyperlipidemia, unspecified: Secondary | ICD-10-CM

## 2012-06-30 DIAGNOSIS — I10 Essential (primary) hypertension: Secondary | ICD-10-CM

## 2012-06-30 DIAGNOSIS — I2510 Atherosclerotic heart disease of native coronary artery without angina pectoris: Secondary | ICD-10-CM

## 2012-06-30 MED ORDER — ATORVASTATIN CALCIUM 40 MG PO TABS: 40.0000 mg | ORAL_TABLET | Freq: Every day | ORAL | Status: DC

## 2012-06-30 MED ORDER — CLOPIDOGREL BISULFATE 75 MG PO TABS: 75.0000 mg | ORAL_TABLET | Freq: Every day | ORAL | Status: DC

## 2012-06-30 NOTE — Telephone Encounter (Signed)
Patient calling wanting to have hand written rx for Atorvastatin 40mg  once daily and Brilinta 90mg  twice b/c he is waiting on a call back from his insurance were his rx must go. He knows he no longer is with [facility] but not sure for [facility]. Also wanted to know if his Brilinta can be changed to generic to be more cost effective for him. I let him know I will forward this message to the nurse for Dr Earmon Phoenix physical signature and to see if he will change his Brilinta. Patient said if he can not switch Brilinta this maybe at next appointment but needs these rx request today if possible.   Micki Riley, CMA

## 2012-06-30 NOTE — Telephone Encounter (Signed)
Per Dr Excell Seltzer the pt can switch from Brilinta to Plavix.  Pt aware and Rx placed at front desk.

## 2012-07-05 ENCOUNTER — Other Ambulatory Visit: Payer: Self-pay | Admitting: *Deleted

## 2012-07-05 DIAGNOSIS — I10 Essential (primary) hypertension: Secondary | ICD-10-CM

## 2012-07-05 DIAGNOSIS — I2510 Atherosclerotic heart disease of native coronary artery without angina pectoris: Secondary | ICD-10-CM

## 2012-07-05 DIAGNOSIS — E785 Hyperlipidemia, unspecified: Secondary | ICD-10-CM

## 2012-07-05 MED ORDER — CLOPIDOGREL BISULFATE 75 MG PO TABS: 75.0000 mg | ORAL_TABLET | Freq: Every day | ORAL | Status: DC

## 2012-07-05 MED ORDER — ATORVASTATIN CALCIUM 40 MG PO TABS: 40.0000 mg | ORAL_TABLET | Freq: Every day | ORAL | Status: DC

## 2012-08-09 ENCOUNTER — Ambulatory Visit: Payer: BC Managed Care – PPO | Admitting: Cardiovascular Disease

## 2012-08-21 ENCOUNTER — Other Ambulatory Visit: Payer: 59

## 2012-08-21 DIAGNOSIS — E785 Hyperlipidemia, unspecified: Secondary | ICD-10-CM

## 2012-08-21 LAB — HEPATIC FUNCTION PANEL
ALT: 22 U/L (ref 0–53)
AST: 19 U/L (ref 0–37)
Albumin: 3.8 g/dL (ref 3.5–5.2)
Total Bilirubin: 0.9 mg/dL (ref 0.3–1.2)
Total Protein: 6.8 g/dL (ref 6.0–8.3)

## 2012-08-21 LAB — LIPID PANEL
Cholesterol: 113 mg/dL (ref 0–200)
HDL: 42.6 mg/dL (ref >39.00)
Triglycerides: 63 mg/dL (ref 0.0–149.0)
VLDL: 12.6 mg/dL (ref 0.0–40.0)

## 2012-08-23 ENCOUNTER — Ambulatory Visit (INDEPENDENT_AMBULATORY_CARE_PROVIDER_SITE_OTHER): Payer: 59 | Admitting: Cardiovascular Disease

## 2012-08-23 ENCOUNTER — Encounter: Payer: Self-pay | Admitting: Cardiovascular Disease

## 2012-08-23 VITALS — BP 130/80 | HR 59 | Ht 67.0 in | Wt 193.0 lb

## 2012-08-23 DIAGNOSIS — E785 Hyperlipidemia, unspecified: Secondary | ICD-10-CM

## 2012-08-23 DIAGNOSIS — I2510 Atherosclerotic heart disease of native coronary artery without angina pectoris: Secondary | ICD-10-CM

## 2012-08-23 NOTE — Progress Notes (Signed)
HPI:  67 year old gentleman presenting for followup evaluation. The patient has coronary artery disease and he presented in August of 2013 with unstable angina. He was found to have significant disease in the left circumflex and he underwent PCI with overlapping drug-eluting stents guided by FFR. He had moderate mid LAD stenosis and medical therapy was recommended. The patient's left ventricular ejection fraction was preserved at 55%. Lipids were just checked earlier this week with a cholesterol of 113, triglycerides 63, HDL 43, and LDL 58. His liver function tests are within normal limits.  The patient is doing quite well. He plays golf several days per week. On days that he doesn't golf, he walks 4 miles. He denies exertional symptoms. He's had no bleeding problems. He specifically denies chest pain, chest pressure, dyspnea, or leg swelling.  Outpatient Encounter Prescriptions as of 08/23/2012  Medication Sig Dispense Refill  . aspirin EC 81 MG tablet Take 1 tablet (81 mg total) by mouth daily.      [name] atorvastatin (LIPITOR) 40 MG tablet Take 1 tablet (40 mg total) by mouth daily.  90 tablet  3  . BENAZEPRIL HCL PO Take 1 tablet by mouth.      [name] CALCIUM PO Take 1 tablet by mouth daily.      . clopidogrel (PLAVIX) 75 MG tablet Take 1 tablet (75 mg total) by mouth daily.  90 tablet  3  . metoprolol succinate (TOPROL-XL) 100 MG 24 hr tablet Take 100 mg by mouth daily.       . Multiple Vitamin (MULTIVITAMIN WITH MINERALS) TABS Take 1 tablet by mouth every morning.      . nitroGLYCERIN (NITROSTAT) 0.4 MG SL tablet Place 1 tablet (0.4 mg total) under the tongue every 5 (five) minutes x 3 doses as needed for chest pain.  25 tablet  6  . zolpidem (AMBIEN) 10 MG tablet Take 10 mg by mouth at bedtime as needed.        No facility-administered encounter medications on file as of 08/23/2012.    Allergies  Allergen Reactions  . Codeine Other (See Comments)    Past Medical History  Diagnosis Date  .  Hypertension   . Hyperlipidemia   . CAD (coronary artery disease)     a. 11/2011 Cath: LM 20 ost, LAD 40 ost, 30-40p, 60-53m, D1 mild plaquing, LCX 70-80 p-m (FFR 0.78, treated with  3.0x20 and 2.75x16 Promus DES), RCA 30-109m, EF 55%, no rwma.   ROS: Negative except as per HPI  BP 130/80  Pulse 59  Ht 5\' 7"  (1.702 m)  Wt 87.544 kg (193 lb)  BMI 30.22 kg/m2  PHYSICAL EXAM: Pt is alert and oriented, NAD HEENT: normal Neck: JVP - normal, carotids 2+= without bruits Lungs: CTA bilaterally CV: RRR without murmur or gallop Abd: soft, NT, Positive BS, no hepatomegaly Ext: no C/C/E, distal pulses intact and equal Skin: warm/dry no rash  EKG:  Normal sinus rhythm 59 beats per minute, within normal limits.  ASSESSMENT AND PLAN 1. Coronary artery disease, native vessel. The patient is stable without anginal symptoms. He is physically active. He should remain on aspirin 81 mg indefinitely. He can stop Plavix based on current guidelines after 12 months. This would give him a stop date at the end of August of this year. Otherwise he should remain on his current medical program. I will see him back in one year for followup.  2. Hypertension. Blood pressure is well controlled. He will continue on benazepril and long-acting  metoprolol.  3. Hyperlipidemia. Lipids were reviewed as outlined in the history of present illness. He will continue on atorvastatin 40 mg daily.  For followup I would like to see him back in 12 months.  Tonny Bollman 08/23/2012 8:55 AM

## 2012-08-23 NOTE — Patient Instructions (Addendum)
Your physician wants you to follow-up in: 1 YEAR with Dr Excell Seltzer.  You will receive a reminder letter in the mail two months in advance. If you don't receive a letter, please call our office to schedule the follow-up appointment.  Your physician recommends that you continue on your current medications as directed. Please refer to the Current Medication list given to you today. You can stop Plavix at the end of August.

## 2012-11-08 ENCOUNTER — Other Ambulatory Visit: Payer: Self-pay

## 2013-01-05 ENCOUNTER — Telehealth: Payer: Self-pay | Admitting: Cardiovascular Disease

## 2013-01-05 NOTE — Telephone Encounter (Signed)
Received request from Nurse, documents faxed for surgical clearance. [name] Fax number: [phone] Attention: 01/05/13/KM

## 2013-01-30 ENCOUNTER — Encounter (HOSPITAL_COMMUNITY): Payer: Self-pay | Admitting: Pharmacy Technician

## 2013-02-02 ENCOUNTER — Encounter (HOSPITAL_COMMUNITY): Payer: Self-pay | Admitting: *Deleted

## 2013-02-08 ENCOUNTER — Other Ambulatory Visit: Payer: Self-pay

## 2013-02-16 ENCOUNTER — Other Ambulatory Visit: Payer: Self-pay | Admitting: Gastroenterology

## 2013-02-16 NOTE — Addendum Note (Signed)
Addended by: Abbie Jablon on: 02/16/2013 01:47 PM   Modules accepted: Orders  

## 2013-02-19 NOTE — Anesthesia Preprocedure Evaluation (Addendum)
Anesthesia Evaluation  Patient identified by MRN, date of birth, ID band Patient awake    Reviewed: Allergy & Precautions, H&P , NPO status , Patient's Chart, lab work & pertinent test results  Airway Mallampati: II TM Distance: >3 FB Neck ROM: Full    Dental  (+) Teeth Intact and Dental Advisory Given   Pulmonary neg pulmonary ROS,  breath sounds clear to auscultation  Pulmonary exam normal       Cardiovascular hypertension, Pt. on medications and Pt. on home beta blockers + CAD and + Cardiac Stents negative cardio ROS  Rhythm:Regular Rate:Normal     Neuro/Psych negative neurological ROS  negative psych ROS   GI/Hepatic negative GI ROS, Neg liver ROS,   Endo/Other  negative endocrine ROS  Renal/GU negative Renal ROS  negative genitourinary   Musculoskeletal negative musculoskeletal ROS (+)   Abdominal   Peds  Hematology negative hematology ROS (+)   Anesthesia Other Findings   Reproductive/Obstetrics                          Anesthesia Physical Anesthesia Plan  ASA: III  Anesthesia Plan: MAC   Post-op Pain Management:    Induction: Intravenous  Airway Management Planned: Simple Face Mask  Additional Equipment:   Intra-op Plan:   Post-operative Plan:   Informed Consent: I have reviewed the patients History and Physical, chart, labs and discussed the procedure including the risks, benefits and alternatives for the proposed anesthesia with the patient or authorized representative who has indicated his/her understanding and acceptance.   Dental advisory given  Plan Discussed with: CRNA  Anesthesia Plan Comments:        Anesthesia Quick Evaluation

## 2013-02-20 ENCOUNTER — Encounter (HOSPITAL_COMMUNITY): Payer: Self-pay

## 2013-02-20 ENCOUNTER — Ambulatory Visit (HOSPITAL_COMMUNITY)
Admission: RE | Admit: 2013-02-20 | Discharge: 2013-02-20 | Disposition: A | Discharge status: Home / Self Care | Payer: Medicare FFS | Source: Ambulatory Visit | Attending: Gastroenterology | Admitting: Gastroenterology

## 2013-02-20 ENCOUNTER — Encounter (HOSPITAL_COMMUNITY): Payer: Medicare FFS | Admitting: Anesthesiology

## 2013-02-20 ENCOUNTER — Encounter (HOSPITAL_COMMUNITY)
Admission: RE | Disposition: A | Discharge status: Home / Self Care | Payer: Self-pay | Source: Ambulatory Visit | Attending: Gastroenterology

## 2013-02-20 ENCOUNTER — Ambulatory Visit (HOSPITAL_COMMUNITY): Payer: Medicare FFS | Admitting: Anesthesiology

## 2013-02-20 DIAGNOSIS — K5900 Constipation, unspecified: Secondary | ICD-10-CM | POA: Insufficient documentation

## 2013-02-20 DIAGNOSIS — Z860100 Personal history of colon polyps, unspecified: Secondary | ICD-10-CM | POA: Insufficient documentation

## 2013-02-20 DIAGNOSIS — K644 Residual hemorrhoidal skin tags: Secondary | ICD-10-CM | POA: Insufficient documentation

## 2013-02-20 DIAGNOSIS — I2510 Atherosclerotic heart disease of native coronary artery without angina pectoris: Secondary | ICD-10-CM | POA: Insufficient documentation

## 2013-02-20 DIAGNOSIS — D126 Benign neoplasm of colon, unspecified: Secondary | ICD-10-CM | POA: Insufficient documentation

## 2013-02-20 DIAGNOSIS — K5730 Diverticulosis of large intestine without perforation or abscess without bleeding: Secondary | ICD-10-CM | POA: Insufficient documentation

## 2013-02-20 DIAGNOSIS — Z9861 Coronary angioplasty status: Secondary | ICD-10-CM | POA: Insufficient documentation

## 2013-02-20 DIAGNOSIS — I10 Essential (primary) hypertension: Secondary | ICD-10-CM | POA: Insufficient documentation

## 2013-02-20 DIAGNOSIS — K648 Other hemorrhoids: Secondary | ICD-10-CM | POA: Insufficient documentation

## 2013-02-20 HISTORY — PX: COLONOSCOPY WITH PROPOFOL: SHX5780

## 2013-02-20 HISTORY — PX: HOT HEMOSTASIS: SHX5433

## 2013-02-20 LAB — BASIC METABOLIC PANEL
CO2: 25 mEq/L (ref 19–32)
Chloride: 102 mEq/L (ref 96–112)
Glucose, Bld: 104 mg/dL — ABNORMAL HIGH (ref 70–99)
Potassium: 3.9 mEq/L (ref 3.5–5.1)
Sodium: 136 mEq/L (ref 135–145)

## 2013-02-20 LAB — HEMOGLOBIN AND HEMATOCRIT, BLOOD: HCT: 38.1 % — ABNORMAL LOW (ref 39.0–52.0)

## 2013-02-20 SURGERY — COLONOSCOPY WITH PROPOFOL
Anesthesia: Monitor Anesthesia Care

## 2013-02-20 MED ORDER — LACTATED RINGERS IV SOLN
INTRAVENOUS | Status: DC
  Administered 2013-02-20: 1000 mL via INTRAVENOUS

## 2013-02-20 MED ORDER — PROPOFOL INFUSION 10 MG/ML OPTIME
INTRAVENOUS | Status: DC | PRN
  Administered 2013-02-20: 120 ug/kg/min via INTRAVENOUS

## 2013-02-20 MED ORDER — SODIUM CHLORIDE 0.9 % IV SOLN: INTRAVENOUS | Status: DC

## 2013-02-20 SURGICAL SUPPLY — 22 items

## 2013-02-20 NOTE — Anesthesia Postprocedure Evaluation (Signed)
Anesthesia Post Note  Patient: Randy [name]  Procedure(s) Performed: Procedure(s) (LRB): COLONOSCOPY WITH PROPOFOL (N/A) HOT HEMOSTASIS (ARGON PLASMA COAGULATION/BICAP) (N/A)  Anesthesia type: MAC  Patient location: PACU  Post pain: Pain level controlled  Post assessment: Post-op Vital signs reviewed  Last Vitals:  Filed Vitals:   02/20/13 0925  BP: 127/79  Temp: 36.7 C  Resp: 11    Post vital signs: Reviewed  Level of consciousness: sedated  Complications: No apparent anesthesia complications

## 2013-02-20 NOTE — Progress Notes (Signed)
Randy [name] 8:49 AM  Subjective: Other than one bout of constipation a week ago patient has been doing well from a GI standpoint and has no new medical problems  Objective: Vital signs stable afebrile no acute distress please see pre-assessment evaluation for exam labs reviewed  Assessment: History of cecal polyp  Plan: Okay to proceed with colonoscopy and anesthesia today  Keison Glendinning E

## 2013-02-20 NOTE — Transfer of Care (Signed)
Immediate Anesthesia Transfer of Care Note  Patient: Randy [name]  Procedure(s) Performed: Procedure(s): COLONOSCOPY WITH PROPOFOL (N/A) HOT HEMOSTASIS (ARGON PLASMA COAGULATION/BICAP) (N/A)  Patient Location: PACU  Anesthesia Type:MAC  Level of Consciousness: sedated  Airway & Oxygen Therapy: Patient Spontanous Breathing and Patient connected to face mask oxygen  Post-op Assessment: Report given to PACU RN and Post -op Vital signs reviewed and stable  Post vital signs: Reviewed and stable  Complications: No apparent anesthesia complications

## 2013-02-20 NOTE — Op Note (Signed)
[facility] [address] [place], [address]   COLONOSCOPY PROCEDURE REPORT  PATIENT: Randy [name], Randy [name]  MR#: [id number] BIRTHDATE: 20-Mar-1946 , 67  yrs. old GENDER: Male ENDOSCOPIST: Vida Rigger, MD REFERRED AO:[id number] Zachery Dauer, M.D. PROCEDURE DATE:  02/20/2013 PROCEDURE:   Colonoscopy with biopsy ASA CLASS:   Class II INDICATIONS:Patient's personal history of colon polyps. MEDICATIONS: propofol (Diprivan) 350mg  IV  DESCRIPTION OF PROCEDURE:   After the risks benefits and alternatives of the procedure were thoroughly explained, informed consent was obtained.  The Pentax Adult Colonscope [id number] endoscope was introduced through the anus and advanced to the cecum, which was identified by both the appendix and ileocecal valve , limited by No adverse events experienced.  to advance to the cecum did require abdominal pressure but no position change The quality of the prep was adequate. .  The instrument was then slowly withdrawn as the colon was fully examined.the findings are recorded below the patient tolerated the procedure well there was no obvious immediate       FINDINGS:  1. Small internal/external hemorrhoids 2. A few left-sided small diverticula 3 tiny cecal polyp only status post cold biopsy 4. Otherwise within normal limits to the cecum  COMPLICATIONS: none  IMPRESSION:  above  RECOMMENDATIONS: await pathology probably followup colonoscopy in 5 years and GI followup when necessary   _______________________________ eSigned:  Vida Rigger, MD 02/20/2013 9:25 AM   WU:[id number] Zachery Dauer, MD

## 2013-02-21 ENCOUNTER — Encounter (HOSPITAL_COMMUNITY): Payer: Self-pay | Admitting: Gastroenterology

## 2013-04-05 HISTORY — PX: EYE SURGERY: SHX253

## 2013-04-17 ENCOUNTER — Other Ambulatory Visit (HOSPITAL_COMMUNITY): Payer: Self-pay | Admitting: Cardiovascular Disease

## 2013-06-01 ENCOUNTER — Encounter (HOSPITAL_COMMUNITY): Payer: Self-pay | Admitting: Emergency Medicine

## 2013-06-01 ENCOUNTER — Emergency Department (HOSPITAL_COMMUNITY)
Admission: EM | Admit: 2013-06-01 | Discharge: 2013-06-01 | Disposition: A | Discharge status: Home / Self Care | Payer: Medicare FFS | Attending: Emergency Medicine | Admitting: Emergency Medicine

## 2013-06-01 ENCOUNTER — Emergency Department (HOSPITAL_COMMUNITY): Payer: Medicare FFS

## 2013-06-01 DIAGNOSIS — R5381 Other malaise: Secondary | ICD-10-CM | POA: Insufficient documentation

## 2013-06-01 DIAGNOSIS — I2510 Atherosclerotic heart disease of native coronary artery without angina pectoris: Secondary | ICD-10-CM | POA: Insufficient documentation

## 2013-06-01 DIAGNOSIS — Z79899 Other long term (current) drug therapy: Secondary | ICD-10-CM | POA: Insufficient documentation

## 2013-06-01 DIAGNOSIS — Z7982 Long term (current) use of aspirin: Secondary | ICD-10-CM | POA: Insufficient documentation

## 2013-06-01 DIAGNOSIS — R5383 Other fatigue: Secondary | ICD-10-CM

## 2013-06-01 DIAGNOSIS — R42 Dizziness and giddiness: Secondary | ICD-10-CM | POA: Insufficient documentation

## 2013-06-01 DIAGNOSIS — E785 Hyperlipidemia, unspecified: Secondary | ICD-10-CM | POA: Insufficient documentation

## 2013-06-01 DIAGNOSIS — I10 Essential (primary) hypertension: Secondary | ICD-10-CM | POA: Insufficient documentation

## 2013-06-01 DIAGNOSIS — I498 Other specified cardiac arrhythmias: Secondary | ICD-10-CM

## 2013-06-01 DIAGNOSIS — I4949 Other premature depolarization: Secondary | ICD-10-CM | POA: Insufficient documentation

## 2013-06-01 DIAGNOSIS — Z9861 Coronary angioplasty status: Secondary | ICD-10-CM | POA: Insufficient documentation

## 2013-06-01 DIAGNOSIS — I499 Cardiac arrhythmia, unspecified: Secondary | ICD-10-CM

## 2013-06-01 DIAGNOSIS — I493 Ventricular premature depolarization: Secondary | ICD-10-CM

## 2013-06-01 LAB — CBC
HCT: 38.6 % — ABNORMAL LOW (ref 39.0–52.0)
Hemoglobin: 14 g/dL (ref 13.0–17.0)
MCH: 33.5 pg (ref 26.0–34.0)
MCHC: 36.3 g/dL — ABNORMAL HIGH (ref 30.0–36.0)
MCV: 92.3 fL (ref 78.0–100.0)
Platelets: 152 K/uL (ref 150–400)
RBC: 4.18 MIL/uL — ABNORMAL LOW (ref 4.22–5.81)
RDW: 12.6 % (ref 11.5–15.5)
WBC: 5.6 10*3/uL (ref 4.0–10.5)

## 2013-06-01 LAB — BASIC METABOLIC PANEL
BUN: 14 mg/dL (ref 6–23)
Chloride: 101 mEq/L (ref 96–112)
Creatinine, Ser: 0.94 mg/dL (ref 0.50–1.35)
GFR calc Af Amer: >90 mL/min (ref >90)
GFR calc non Af Amer: 86 mL/min — ABNORMAL LOW (ref >90)
Glucose, Bld: 113 mg/dL — ABNORMAL HIGH (ref 70–99)
Potassium: 4.3 mEq/L (ref 3.7–5.3)

## 2013-06-01 LAB — BASIC METABOLIC PANEL WITH GFR
CO2: 26 meq/L (ref 19–32)
Calcium: 9.2 mg/dL (ref 8.4–10.5)
Sodium: 138 meq/L (ref 137–147)

## 2013-06-01 LAB — I-STAT TROPONIN, ED: Troponin i, poc: 0 ng/mL (ref 0.00–0.08)

## 2013-06-01 NOTE — ED Notes (Signed)
Cardiac monitor showing Ventricular Bigeminy. Pt denies h/o abnormal heart rhythm.

## 2013-06-01 NOTE — Discharge Instructions (Signed)
Premature Beats °A premature beat is an extra heartbeat that happens earlier than normal. Premature beats are called premature atrial contractions (PACs) or premature ventricular contractions (PVCs) depending on the area of the heart where they start. °CAUSES  °Premature beats may be brought on by a variety of factors including: °· Emotional stress. °· Lack of sleep. °· Caffeine. °· Asthma medicines. °· Stimulants. °· Herbal teas. °· Dietary supplements. °· Alcohol. °In most cases, premature beats are not dangerous and are not a sign of serious heart disease. Most patients evaluated for premature beats have completely normal heart function. Rarely, premature beats may be a sign of more significant heart problems or medical illness. °SYMPTOMS  °Premature beats may cause palpitations. This means you feel like your heart is skipping a beat or beating harder than usual. Sometimes, slight chest pain occurs with premature beats, lasting only a few seconds. This pain has been described as a "flopping" feeling inside the chest. In many cases, premature beats do not cause any symptoms and they are only detected when an electrocardiography test (EKG) or heart monitoring is performed. °DIAGNOSIS  °Your caregiver may run some tests to evaluate your heart such as an EKG or echocardiography. You may need to wear a portable heart monitor for several days to record the electrical activity of your heart. Blood testing may also be performed to check your electrolytes and thyroid function. °TREATMENT  °Premature beats usually go away with rest. If the problem continues, your caregiver will determine a treatment plan for you.  °HOME CARE INSTRUCTIONS °· Get plenty of rest over the next few days until your symptoms improve. °· Avoid coffee, tea, alcohol, and soda (pop, cola). °· Do not smoke. °SEEK MEDICAL CARE IF: °· Your symptoms continue after 1 to 2 days of rest. °· You have new symptoms, such as chest pain or trouble  breathing. °SEEK IMMEDIATE MEDICAL CARE IF: °· You have severe chest pain or abdominal pain. °· You have pain that radiates into the neck, arm, or jaw. °· You faint or have extreme weakness. °· You have shortness of breath. °· Your heartbeat races for more than 5 seconds. °MAKE SURE YOU: °· Understand these instructions. °· Will watch your condition. °· Will get help right away if you are not doing well or get worse. °Document Released: 04/29/2004 Document Revised: 06/14/2011 Document Reviewed: 11/23/2010 °ExitCare® Patient Information ©2014 [employer]. ° °

## 2013-06-01 NOTE — ED Notes (Signed)
Pt with hx of cardiac stent placement to ED c/o sternal chest discomfort.  His blood pressure cuff told him that his pulse was 40.  Pt took 3 nitro with some relief.  Pt states this is the same feeling he had when he had a stent placed.

## 2013-06-01 NOTE — ED Provider Notes (Signed)
CSN: [id number]     Arrival date & time 06/01/13  1303 History   First MD Initiated Contact with Patient 06/01/13 1507     Chief Complaint  Patient presents with  . Bradycardia     (Consider location/radiation/quality/duration/timing/severity/associated sxs/prior Treatment) HPI Comments: Pt was having a persistent "unusual" sensation in upper chest wall area, does not describe it as painful, no SOb, sweats, nausea.  He was using BP cuff and it reported his HR as down to 40's at time and he decided he needed to be evaluated.  He shoveled snow hard yesterday morning sweated a lot.  No CP, SOB during this.  He also walks 4 miles regularly with no difficulty.  He admits to drinking a lot of coffee this AM, but otherwise no change in meds or usual habits.  He has no CP or unusual sensation at this moment.    Patient is a 68 y.o. male presenting with weakness. The history is provided by the patient, the spouse and medical records.  Weakness This is a new problem. Pertinent negatives include no chest pain and no shortness of breath.    Past Medical History  Diagnosis Date  . Hypertension   . Hyperlipidemia   . CAD (coronary artery disease)     a. 11/2011 Cath: LM 20 ost, LAD 40 ost, 30-40p, 60-50m, D1 mild plaquing, LCX 70-80 p-m (FFR 0.78, treated with  3.0x20 and 2.75x16 Promus DES), RCA 30-16m, EF 55%, no rwma.   Past Surgical History  Procedure Laterality Date  . Coronary angioplasty with stent placement      8'13-stent placed  . Colonoscopy with propofol N/A 02/20/2013    Procedure: COLONOSCOPY WITH PROPOFOL;  Surgeon: Jeryl Columbia, MD;  Location: [facility] ENDOSCOPY;  Service: Endoscopy;  Laterality: N/A;  . Hot hemostasis N/A 02/20/2013    Procedure: HOT HEMOSTASIS (ARGON PLASMA COAGULATION/BICAP);  Surgeon: Jeryl Columbia, MD;  Location: [facility];  Service: Endoscopy;  Laterality: N/A;   Family History  Problem Relation Age of Onset  . Heart attack Brother     died @ 36  . Diabetes  type I Brother     died @ 59  . Coronary artery disease Brother     alive @ 22  . Other Mother     died @ 50 of "heart issues"  . Pneumonia Father     died @ 58   History  Substance Use Topics  . Smoking status: Never Smoker   . Smokeless tobacco: Not on file  . Alcohol Use: 0.6 oz/week    1 Glasses of wine per week     Comment: occas etoh    Review of Systems  Constitutional: Negative for fatigue.  Respiratory: Negative for cough, chest tightness and shortness of breath.   Cardiovascular: Negative for chest pain.  Gastrointestinal: Negative for nausea.  Neurological: Positive for weakness and light-headedness. Negative for dizziness, syncope and speech difficulty.  All other systems reviewed and are negative.      Allergies  Codeine  Home Medications   Current Outpatient Rx  Name  Route  Sig  Dispense  Refill  . aspirin EC 81 MG tablet   Oral   Take 81 mg by mouth daily.         [name] atorvastatin (LIPITOR) 40 MG tablet   Oral   Take 1 tablet (40 mg total) by mouth daily.   90 tablet   3   . benazepril (LOTENSIN) 40 MG tablet   Oral  Take 40 mg by mouth daily.         [name] CALCIUM PO   Oral   Take 1 tablet by mouth daily.         . Glucosamine HCl (GLUCOSAMINE PO)   Oral   Take 1 tablet by mouth daily.         . metoprolol succinate (TOPROL-XL) 100 MG 24 hr tablet   Oral   Take 100 mg by mouth daily.          . Multiple Vitamin (MULTIVITAMIN WITH MINERALS) TABS   Oral   Take 1 tablet by mouth every morning.         . Naproxen Sodium (ALEVE PO)   Oral   Take 2 tablets by mouth daily as needed (pain).         . nitroGLYCERIN (NITROSTAT) 0.4 MG SL tablet   Sublingual   Place 0.4 mg under the tongue every 5 (five) minutes as needed for chest pain.         [name] zolpidem (AMBIEN) 10 MG tablet   Oral   Take 10 mg by mouth at bedtime as needed for sleep.           BP 102/66  Pulse 58  Temp(Src) 98.2 F (36.8 C) (Oral)  Resp 10   SpO2 97% Physical Exam  Nursing note and vitals reviewed. Constitutional: He is oriented to person, place, and time. He appears well-developed and well-nourished.  HENT:  Head: Normocephalic and atraumatic.  Eyes: Conjunctivae and EOM are normal. No scleral icterus.  Neck: Neck supple.  Cardiovascular: Regular rhythm, normal heart sounds and intact distal pulses.   Extrasystoles are present. Bradycardia present.   Pulmonary/Chest: Effort normal.  Abdominal: Soft. He exhibits no distension. There is no tenderness.  Musculoskeletal: He exhibits no edema.  Neurological: He is alert and oriented to person, place, and time. He exhibits normal muscle tone. Coordination normal.  Skin: Skin is warm and dry. No rash noted. No pallor.  Psychiatric: He has a normal mood and affect.    ED Course  Procedures (including critical care time) Labs Review Labs Reviewed  CBC - Abnormal; Notable for the following:    RBC 4.18 (*)    HCT 38.6 (*)    MCHC 36.3 (*)    All other components within normal limits  BASIC METABOLIC PANEL - Abnormal; Notable for the following:    Glucose, Bld 113 (*)    GFR calc non Af Amer 86 (*)    All other components within normal limits  I-STAT TROPOININ, ED   Imaging Review Dg Chest Portable 1 View  06/01/2013   CLINICAL DATA:  Irregular heart rate.  Hypertension.  EXAM: PORTABLE CHEST - 1 VIEW  COMPARISON:  12/02/2011  FINDINGS: The heart size and mediastinal contours are within normal limits. Both lungs are clear. The visualized skeletal structures are unremarkable.  IMPRESSION: No active disease.   Electronically Signed   By: Lajean Manes M.D.   On: 06/01/2013 13:57    EKG Interpretation at time 13:08 shows SR at rate 81 with freq PVC's in a bigeminy pattern.  otherwise normal.  RA sat is 97% and I interpret to be adequate.   MDM   Final diagnoses:  Bigeminy  Frequent PVCs    Pt with HR of 80's, but due to bigeminy, effective pulse is half that, in the  40's.  This was measured by his blood pressure cuff.  Troponin is normal, no CP at  this time.  Pt admits sweated a lot yesterday shoveling snow, didn't sleep well last night, had a lot of caffeine this AM.  I suspect this is self limiting, pt and family reassured.  Will inform [place] cardiology and recommend close outpt follow up.  Pt walks 4 miles often and even with shoveling snow yesterday did not have any CP, tightness, lightheadedness, SOB, nausea.      Saddie Benders. Mickie Badders, MD 06/01/13 1550

## 2013-06-01 NOTE — ED Notes (Signed)
Multiple PVC's noted on cardiac monitor. Pt states he has never been told he has PVC's. Pt reports he had two "strong" cups of coffee today and his chest discomfort started about 10-15 minutes after his second cup.

## 2013-06-05 ENCOUNTER — Encounter: Payer: Self-pay | Admitting: Physician Assistant

## 2013-06-05 ENCOUNTER — Ambulatory Visit (INDEPENDENT_AMBULATORY_CARE_PROVIDER_SITE_OTHER): Payer: 59 | Admitting: Physician Assistant

## 2013-06-05 VITALS — BP 140/88 | HR 57 | Ht 67.0 in | Wt 205.0 lb

## 2013-06-05 DIAGNOSIS — I2510 Atherosclerotic heart disease of native coronary artery without angina pectoris: Secondary | ICD-10-CM

## 2013-06-05 DIAGNOSIS — I4949 Other premature depolarization: Secondary | ICD-10-CM

## 2013-06-05 DIAGNOSIS — I493 Ventricular premature depolarization: Secondary | ICD-10-CM

## 2013-06-05 DIAGNOSIS — E785 Hyperlipidemia, unspecified: Secondary | ICD-10-CM

## 2013-06-05 DIAGNOSIS — R079 Chest pain, unspecified: Secondary | ICD-10-CM

## 2013-06-05 DIAGNOSIS — I10 Essential (primary) hypertension: Secondary | ICD-10-CM

## 2013-06-05 NOTE — Progress Notes (Signed)
[address] [place], [place]  [address] Phone: [phone] Fax:  [phone]  Date:  06/05/2013   ID:  Randy [name], Randy [name] [birth year], MRN [id number]  PCP:  Gerrit Heck, MD  Cardiologist:  Dr. Sherren Mocha     History of Present Illness: Randy [name] is a 68 y.o. male with a hx of CAD, HTN, HL.  He was admitted in 11/2011 with [place] and underwent PCI with Promus DES to the CFX x 2.  Mod disease in the LAD was treated medically.  Last seen by Dr. Burt Knack 08/2012. Patient was recently seen in the emergency room 06/01/13. He presented with complaints of weakness. He was noted to have ventricular bigeminy on the ECG. Of note, ECG is not available in the computer at this time for review. Cardiac enzymes were normal. Hemoglobin and electrolytes were also normal. TSH was not obtained.  He was asked to follow up with cardiology to  Patient tells me he actually went to the emergency room with discomfort in his chest. He states that this was similar to what he had prior to his PCI. He had been out shoveling snow the day prior. He had no chest discomfort with this. He had no associated dyspnea or radiating symptoms with his chest discomfort. Nitroglycerin provided no relief. He has not had a recurrence. He feels much better. He denies orthopnea, PND or edema. He denies syncope near syncope. He's had some rare lightheadedness with standing too quickly.  Recent Labs: 08/21/2012: ALT 22; HDL Cholesterol 42.60; LDL (calc) 58  06/01/2013: Creatinine 0.94; Hemoglobin 14.0; Potassium 4.3   Wt Readings from Last 3 Encounters:  06/05/13 205 lb (92.987 kg)  08/23/12 193 lb (87.544 kg)  03/06/12 192 lb (87.091 kg)     Past Medical History  Diagnosis Date  . Hypertension   . Hyperlipidemia   . CAD (coronary artery disease)     a. 11/2011 Cath: LM 20 ost, LAD 40 ost, 30-40p, 60-61m, D1 mild plaquing, LCX 70-80 p-m (FFR 0.78, treated with  3.0x20 and 2.75x16 Promus DES), RCA 30-28m, EF  55%, no rwma.    Current Outpatient Prescriptions  Medication Sig Dispense Refill  . aspirin EC 81 MG tablet Take 81 mg by mouth daily.      [name] atorvastatin (LIPITOR) 40 MG tablet Take 1 tablet (40 mg total) by mouth daily.  90 tablet  3  . benazepril (LOTENSIN) 40 MG tablet Take 40 mg by mouth daily.      [name] CALCIUM PO Take 1 tablet by mouth daily.      . Glucosamine HCl (GLUCOSAMINE PO) Take 1 tablet by mouth daily.      . metoprolol succinate (TOPROL-XL) 100 MG 24 hr tablet Take 100 mg by mouth daily.       . Multiple Vitamin (MULTIVITAMIN WITH MINERALS) TABS Take 1 tablet by mouth every morning.      . Naproxen Sodium (ALEVE PO) Take 2 tablets by mouth daily as needed (pain).      . nitroGLYCERIN (NITROSTAT) 0.4 MG SL tablet Place 0.4 mg under the tongue every 5 (five) minutes as needed for chest pain.      [name] zolpidem (AMBIEN) 10 MG tablet Take 10 mg by mouth at bedtime as needed for sleep.        No current facility-administered medications for this visit.    Allergies:   Codeine   Social History:  The patient  reports that he has never smoked.  He does not have any smokeless tobacco history on file. He reports that he drinks about 0.6 ounces of alcohol per week. He reports that he does not use illicit drugs.   Family History:  The patient's family history includes Coronary artery disease in his brother; Diabetes type I in his brother; Heart attack in his brother; Other in his mother; Pneumonia in his father.   ROS:  Please see the history of present illness.   He has occasional nasal congestion.   All other systems reviewed and negative.   PHYSICAL EXAM: VS:  BP 140/88  Pulse 57  Ht 5\' 7"  (1.702 m)  Wt 205 lb (92.987 kg)  BMI 32.10 kg/m2 Well nourished, well developed, in no acute distress HEENT: normal Neck: no JVD Vascular: no carotid bruits Cardiac:  normal S1, S2; RRR; no murmur Lungs:  clear to auscultation bilaterally, no wheezing, rhonchi or rales Abd: soft,  nontender, no hepatomegaly Ext: no edema Skin: warm and dry Neuro:  CNs 2-12 intact, no focal abnormalities noted  EKG:  NSR, HR 57, normal axis, no ST changes, PVC     ASSESSMENT AND PLAN:  1. Chest Pain: He has typical and atypical features. His symptoms that brought him to the emergency room reminded him of what he had prior to his PCI. However, he has been able to exert himself without significant chest symptoms or shortness of breath. He has had more frequent PVCs. I reviewed his prior ECGs.  He has not had frequent PVCs noted in the past. Electrolytes were normal when he recently went to the emergency room. I have suggested that we proceed with an ETT-Myoview. He had moderate nonobstructive disease in the LAD at the time of his catheterization in 2013. This will allow to assess for anterior ischemia. We will also be able to assess for exercise-induced ventricular arrhythmias and to reassess his EF. 2. CAD:  Proceed with Myoview as noted. Continue aspirin, statin, beta blocker. 3. Hypertension: Controlled. 4. Hyperlipidemia: Continue statin. 5. Disposition: Keep followup with Dr. Burt Knack as arranged in May 2015.  Signed, Richardson Dopp, PA-C  06/05/2013 1:39 PM

## 2013-06-05 NOTE — Patient Instructions (Signed)
Your physician recommends that you schedule a follow-up appointment in:  AS SCHEDULED  IN   MAY WITH  DR Burt Knack Your physician recommends that you continue on your current medications as directed. Please refer to the Current Medication list given to you today. Your physician has requested that you have en exercise stress myoview. For further information please visit [email or web address]. Please follow instruction sheet, as given.

## 2013-06-11 ENCOUNTER — Ambulatory Visit (HOSPITAL_COMMUNITY): Payer: Medicare FFS | Attending: Cardiology | Admitting: Radiology

## 2013-06-11 VITALS — BP 142/87 | HR 60 | Ht 67.0 in | Wt 200.0 lb

## 2013-06-11 DIAGNOSIS — I25810 Atherosclerosis of coronary artery bypass graft(s) without angina pectoris: Secondary | ICD-10-CM

## 2013-06-11 DIAGNOSIS — I2510 Atherosclerotic heart disease of native coronary artery without angina pectoris: Secondary | ICD-10-CM | POA: Insufficient documentation

## 2013-06-11 DIAGNOSIS — I10 Essential (primary) hypertension: Secondary | ICD-10-CM | POA: Insufficient documentation

## 2013-06-11 DIAGNOSIS — I493 Ventricular premature depolarization: Secondary | ICD-10-CM

## 2013-06-11 DIAGNOSIS — R079 Chest pain, unspecified: Secondary | ICD-10-CM | POA: Insufficient documentation

## 2013-06-11 DIAGNOSIS — E785 Hyperlipidemia, unspecified: Secondary | ICD-10-CM | POA: Insufficient documentation

## 2013-06-11 MED ORDER — TECHNETIUM TC 99M SESTAMIBI GENERIC - CARDIOLITE
11.0000 | Freq: Once | INTRAVENOUS | Status: AC | PRN
  Administered 2013-06-11: 11 via INTRAVENOUS

## 2013-06-11 MED ORDER — TECHNETIUM TC 99M SESTAMIBI GENERIC - CARDIOLITE
33.0000 | Freq: Once | INTRAVENOUS | Status: AC | PRN
  Administered 2013-06-11: 33 via INTRAVENOUS

## 2013-06-11 NOTE — Progress Notes (Signed)
Randy [name] [address] [place], [place] [address] [phone]    Cardiology Nuclear Med Study  Randy [name] is a 68 y.o. male     MRN : [id number]     DOB: [birth year]  Procedure Date: 06/11/2013  Nuclear Med Background Indication for Stress Test:  Evaluation for Ischemia, Stent Patency and Patient seen in hospital on 05-2013 for Chest Pain,Ventricular Bigeminy, enzymes negative History:  CAD, Stent Cfx, Cath (mod dz) Cardiac Risk Factors: Family History - CAD, Hypertension and Lipids  Symptoms:  Chest Pain (last date of chest discomfort was last Friday)   Nuclear Pre-Procedure Caffeine/Decaff Intake:  None > 12 hrs NPO After: 7:00pm   Lungs:  clear O2 Sat: 95% on room air. IV 0.9% NS with Angio Cath:  20g  IV Site: R Antecubital x 1, tolerated well IV Started by:  Irven Baltimore, RN  Chest Size (in):  44 Cup Size: n/a  Height: 5\' 7"  (1.702 m)  Weight:  200 lb (90.719 kg)  BMI:  Body mass index is 31.32 kg/(m^2). Tech Comments:  Held Toprol x 24 hrs. Took Lotensin this am. Irven Baltimore, RN.    Nuclear Med Study 1 or 2 day study: 1 day  Stress Test Type:  Stress  Reading MD: N/A  Order Authorizing Provider:  Sherren Mocha, MD, and Richardson Dopp, [facility]  Resting Radionuclide: Technetium 34m Sestamibi  Resting Radionuclide Dose: 11.0 mCi   Stress Radionuclide:  Technetium 74m Sestamibi  Stress Radionuclide Dose: 33.0 mCi           Stress Protocol Rest HR: 60 Stress HR: 146  Rest BP: 142/87 Stress BP: 220/88  Exercise Time (min): 7:00 METS: 8.5           Dose of Adenosine (mg):  n/a Dose of Lexiscan: n/a mg  Dose of Atropine (mg): n/a Dose of Dobutamine: n/a mcg/kg/min (at max HR)  Stress Test Technologist: Glade Lloyd, BS-ES  Nuclear Technologist:  Vedia Pereyra, CNMT     Rest Procedure:  Myocardial perfusion imaging was performed at rest 45 minutes following the intravenous administration of Technetium 56m Sestamibi. Rest ECG: NSR  - Normal EKG  Stress Procedure:  The patient exercised on the treadmill utilizing the Bruce Protocol for 7:00 minutes. The patient stopped due to fatigue and denied any chest pain.  Technetium 9m Sestamibi was injected at peak exercise and myocardial perfusion imaging was performed after a brief delay. Stress ECG: Less than 2 mm upsloping ST depression in the inferior leads  Ovreall electrically negative for ischemia.    QPS Raw Data Images: Soft tissue (diaphragm, bowel activity) underlie heart.   Stress Images: Mild thinning with decreased counts in the inferior wall (base, minimally mid)  Otherwise normal perfusion.   Rest Images:  Comparison with the stress images reveals no significant change. Subtraction (SDS):  No evidence of ischemia. Transient Ischemic Dilatation (Normal <1.22):  1.03 Lung/Heart Ratio (Normal <0.45):  0.30  Quantitative Gated Spect Images QGS EDV:  86 ml QGS ESV:  33 ml  Impression Exercise Capacity:  Good exercise capacity. BP Response:  Hypertensive blood pressure response. Clinical Symptoms:  No chest pain. ECG Impression:  No significant ST segment change suggestive of ischemia. Comparison with Prior Nuclear Study:No prior study to compare  Overall Impression:  Probable normal perfusion and very mild soft tissue attenuation (diaphragm)  No significant ischemia or scar.    LV Ejection Fraction: 62%.  LV Wall Motion:  NL LV Function;  [place]  Randy [name]

## 2013-06-12 ENCOUNTER — Encounter: Payer: Self-pay | Admitting: Physician Assistant

## 2013-06-13 ENCOUNTER — Telehealth: Payer: Self-pay | Admitting: *Deleted

## 2013-06-13 NOTE — Telephone Encounter (Signed)
pt notified about normal myoview with verbal understanding. Pt asked for his cell # to be his primary contact #. I said no problem and changed it to his primary #. Pt said thank you.

## 2013-06-20 ENCOUNTER — Encounter: Payer: Self-pay | Admitting: Cardiovascular Disease

## 2013-08-23 ENCOUNTER — Telehealth: Payer: Self-pay | Admitting: Cardiovascular Disease

## 2013-08-23 NOTE — Telephone Encounter (Signed)
LMTCB ./CY SPOKE WITH  PT ,  PER PT  DR BARNES, HAD  CHECKED  LIPIDS  EARLIER  THIS   YEAR.  INSTRUCTED PT  TO   HAVE  DR BARNES  OFFICE  FAX COPY  OF  MOST RECENT LABS TO OUR OFFICE  SHOULD NOT  NEED  TO  CHECK   NEXT   WEEK  AS WOULD  BE  TO SOON  IF  HAS  HAD   LAB  DONE  EARLIER .WILL FORWARD TO  DR Burt Knack .Adonis Housekeeper

## 2013-08-23 NOTE — Telephone Encounter (Signed)
New message        Pt would like to know if he needs to do fasting labs at his next appt

## 2013-08-27 NOTE — Telephone Encounter (Signed)
Will forward to [facility]

## 2013-08-28 NOTE — Telephone Encounter (Signed)
I spoke with Dr Ane Payment office and requested that they send a copy of labs to our office for appointment tomorrow.

## 2013-08-29 ENCOUNTER — Encounter: Payer: Self-pay | Admitting: Cardiovascular Disease

## 2013-08-29 ENCOUNTER — Ambulatory Visit (INDEPENDENT_AMBULATORY_CARE_PROVIDER_SITE_OTHER): Payer: Medicare FFS | Admitting: Cardiovascular Disease

## 2013-08-29 VITALS — BP 124/80 | HR 59 | Ht 67.0 in | Wt 192.0 lb

## 2013-08-29 DIAGNOSIS — E785 Hyperlipidemia, unspecified: Secondary | ICD-10-CM

## 2013-08-29 DIAGNOSIS — I10 Essential (primary) hypertension: Secondary | ICD-10-CM

## 2013-08-29 DIAGNOSIS — I2510 Atherosclerotic heart disease of native coronary artery without angina pectoris: Secondary | ICD-10-CM

## 2013-08-29 MED ORDER — ATORVASTATIN CALCIUM 40 MG PO TABS: 40.0000 mg | ORAL_TABLET | Freq: Every day | ORAL | Status: DC

## 2013-08-29 NOTE — Patient Instructions (Signed)
Your physician wants you to follow-up in: 1 YEAR with Dr Cooper.  You will receive a reminder letter in the mail two months in advance. If you don't receive a letter, please call our office to schedule the follow-up appointment.  Your physician recommends that you continue on your current medications as directed. Please refer to the Current Medication list given to you today.  

## 2013-08-29 NOTE — Progress Notes (Signed)
HPI:  68 year old gentleman presenting for followup evaluation. The patient has coronary artery disease and he presented in August of 2013 with unstable angina. He was found to have significant disease in the left circumflex and he underwent PCI with overlapping drug-eluting stents guided by FFR. He had moderate mid LAD stenosis and medical therapy was recommended. The patient's left ventricular ejection fraction was preserved at 55%. Lipids were last checked one year ago with a cholesterol of 113, triglycerides 63, HDL 43, and LDL 58. His liver function tests are within normal limits.  He had chest pain and PVCs a few months ago and underwent an exercise Myoview stress scan. This showed no ischemia. He's done very well since that time. He realizes that coffee consumption is closely linked to his palpitations. Otherwise he has not had much trouble. He specifically denies recurrence of chest pain or pressure. He denies shortness of breath, edema, orthopnea, or PND. He remains physically active with golf and walking. He has no symptoms with exertion.  Outpatient Encounter Prescriptions as of 08/29/2013  Medication Sig  . aspirin EC 81 MG tablet Take 81 mg by mouth daily.  [name] atorvastatin (LIPITOR) 40 MG tablet Take 1 tablet (40 mg total) by mouth daily.  . benazepril (LOTENSIN) 40 MG tablet Take 40 mg by mouth daily.  [name] CALCIUM PO Take 1 tablet by mouth daily.  . Glucosamine HCl (GLUCOSAMINE PO) Take 1 tablet by mouth daily.  . metoprolol succinate (TOPROL-XL) 100 MG 24 hr tablet Take 100 mg by mouth daily.   . Multiple Vitamin (MULTIVITAMIN WITH MINERALS) TABS Take 1 tablet by mouth every morning.  . Naproxen Sodium (ALEVE PO) Take 2 tablets by mouth daily as needed (pain).  . nitroGLYCERIN (NITROSTAT) 0.4 MG SL tablet Place 0.4 mg under the tongue every 5 (five) minutes as needed for chest pain.  [name] zolpidem (AMBIEN) 10 MG tablet Take 10 mg by mouth at bedtime as needed for sleep.     Allergies    Allergen Reactions  . Codeine Other (See Comments)    Unknown     Past Medical History  Diagnosis Date  . Hypertension   . Hyperlipidemia   . CAD (coronary artery disease)     a. 11/2011 Cath: LM 20 ost, LAD 40 ost, 30-40p, 60-68m, D1 mild plaquing, LCX 70-80 p-m (FFR 0.78, treated with  3.0x20 and 2.75x16 Promus DES), RCA 30-54m, EF 55%, no rwma.;  b.  ETT-Myoview (06/2013):  Ex 7:00, no ST changes, soft tissue atten, no scar or ischemia, EF 62%.   ROS: Negative except as per HPI  BP 124/80  Pulse 59  Ht 5\' 7"  (1.702 m)  Wt 87.091 kg (192 lb)  BMI 30.06 kg/m2  PHYSICAL EXAM: Pt is alert and oriented, NAD HEENT: normal Neck: JVP - normal, carotids 2+= without bruits Lungs: CTA bilaterally CV: RRR without murmur or gallop Abd: soft, NT, Positive BS, no hepatomegaly Ext: no C/C/E, distal pulses intact and equal Skin: warm/dry no rash  Myocardial perfusion scan 06/12/2013: QPS  Raw Data Images: Soft tissue (diaphragm, bowel activity) underlie heart.  Stress Images: Mild thinning with decreased counts in the inferior wall (base, minimally mid) Otherwise normal perfusion.  Rest Images: Comparison with the stress images reveals no significant change.  Subtraction (SDS): No evidence of ischemia.  Transient Ischemic Dilatation (Normal <1.22): 1.03  Lung/Heart Ratio (Normal <0.45): 0.30  Quantitative Gated Spect Images  QGS EDV: 86 ml  QGS ESV: 33 ml  Impression  Exercise  Capacity: Good exercise capacity.  BP Response: Hypertensive blood pressure response.  Clinical Symptoms: No chest pain.  ECG Impression: No significant ST segment change suggestive of ischemia.  Comparison with Prior Nuclear Study:No prior study to compare  Overall Impression: Probable normal perfusion and very mild soft tissue attenuation (diaphragm) No significant ischemia or scar.  LV Ejection Fraction: 62%. LV Wall Motion: NL LV Function; NL Wall Motion  Dorris Carnes   ASSESSMENT AND PLAN 1. Coronary  artery disease, native vessel. The patient is having no symptoms of angina. He had a low risk stress Myoview scan as outlined above. He will continue on his current medical program without changes. Medications include low-dose aspirin, and ACE inhibitor, statin drug, and a beta blocker.  2. Hypertension. Blood pressure is well controlled. He will continue on benazepril and long-acting metoprolol.  3. Hyperlipidemia. Patient is on atorvastatin 40 mg. Lipids 08/21/2012:  Lipid Panel     Component Value Date/Time   CHOL 113 08/21/2012 0744   TRIG 63.0 08/21/2012 0744   HDL 42.60 08/21/2012 0744   CHOLHDL 3 08/21/2012 0744   VLDL 12.6 08/21/2012 0744   LDLCALC 58 08/21/2012 0744    For followup I would like to see him back in 12 months.  Sherren Mocha 08/29/2013 11:51 AM

## 2013-09-02 ENCOUNTER — Encounter: Payer: Self-pay | Admitting: Cardiovascular Disease

## 2013-10-28 IMAGING — CR DG CHEST 2V
2 series · 2 of 2 positions shown · non-contrast
Comparison: 10/24/2008.

CLINICAL DATA: Chest pain.

CHEST - 2 VIEW

[w chest pa]
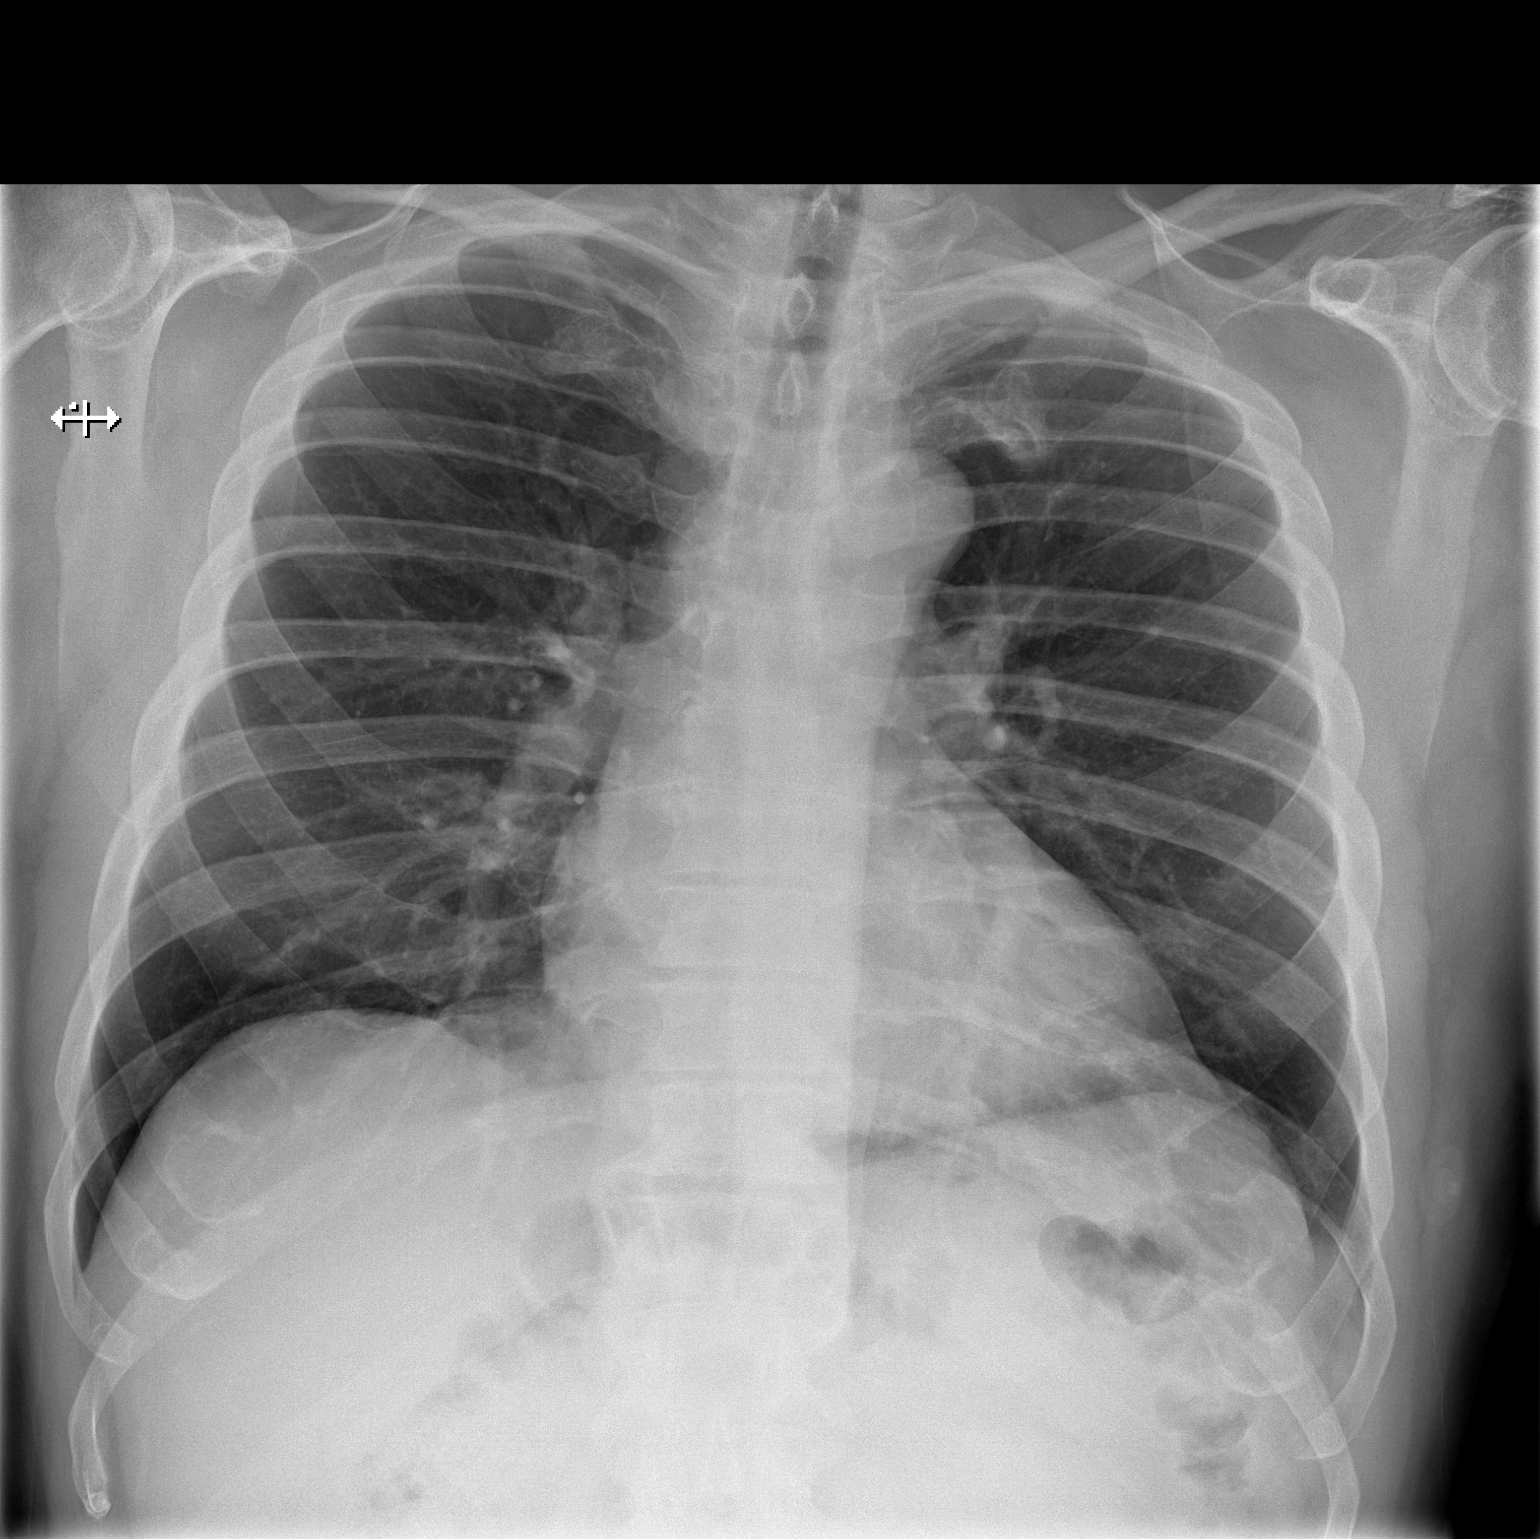

[w chest lat]
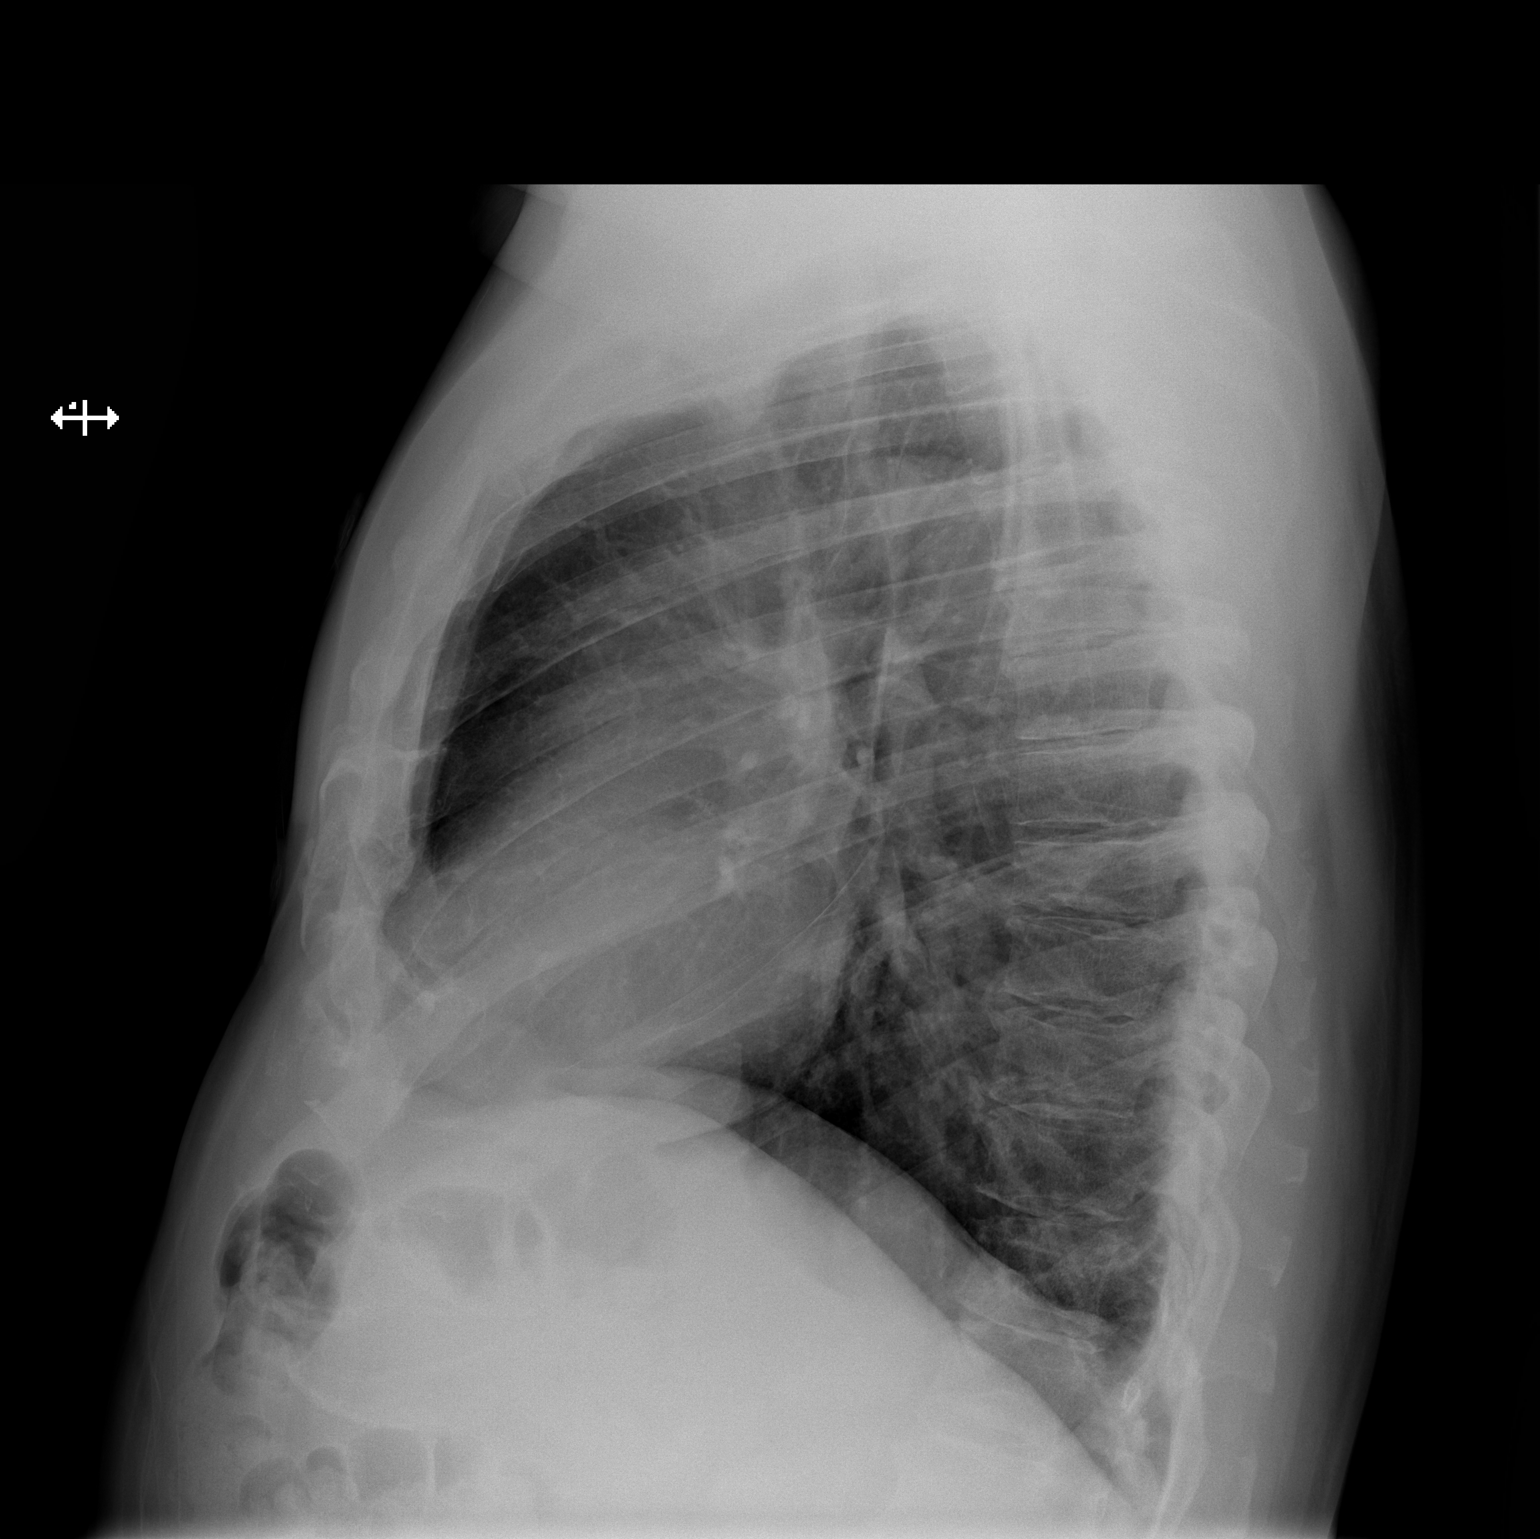

[2 of 2 positions shown; findings below may reference images not displayed]

FINDINGS: The heart size and mediastinal contours are normal. The
lungs are clear. There is no pleural effusion or pneumothorax. No
acute osseous findings are identified.  There is stable spurring at
the left first costochondral junction.
IMPRESSION: Stable examination.  No active cardiopulmonary process.

## 2014-03-14 ENCOUNTER — Encounter (HOSPITAL_COMMUNITY): Payer: Self-pay | Admitting: Internal Medicine

## 2014-06-24 ENCOUNTER — Observation Stay (HOSPITAL_COMMUNITY)
Admission: EM | Admit: 2014-06-24 | Discharge: 2014-06-25 | Disposition: A | Discharge status: Home / Self Care | Payer: Medicare PPO | Attending: Cardiovascular Disease | Admitting: Cardiovascular Disease

## 2014-06-24 ENCOUNTER — Encounter (HOSPITAL_COMMUNITY): Payer: Self-pay

## 2014-06-24 DIAGNOSIS — I10 Essential (primary) hypertension: Secondary | ICD-10-CM | POA: Diagnosis not present

## 2014-06-24 DIAGNOSIS — Z7982 Long term (current) use of aspirin: Secondary | ICD-10-CM | POA: Diagnosis not present

## 2014-06-24 DIAGNOSIS — I2510 Atherosclerotic heart disease of native coronary artery without angina pectoris: Secondary | ICD-10-CM | POA: Diagnosis not present

## 2014-06-24 DIAGNOSIS — R079 Chest pain, unspecified: Principal | ICD-10-CM | POA: Insufficient documentation

## 2014-06-24 DIAGNOSIS — E785 Hyperlipidemia, unspecified: Secondary | ICD-10-CM | POA: Diagnosis not present

## 2014-06-24 DIAGNOSIS — I493 Ventricular premature depolarization: Secondary | ICD-10-CM | POA: Insufficient documentation

## 2014-06-24 DIAGNOSIS — Z79899 Other long term (current) drug therapy: Secondary | ICD-10-CM | POA: Diagnosis not present

## 2014-06-24 DIAGNOSIS — I259 Chronic ischemic heart disease, unspecified: Secondary | ICD-10-CM | POA: Insufficient documentation

## 2014-06-24 DIAGNOSIS — Z955 Presence of coronary angioplasty implant and graft: Secondary | ICD-10-CM | POA: Insufficient documentation

## 2014-06-24 DIAGNOSIS — F419 Anxiety disorder, unspecified: Secondary | ICD-10-CM | POA: Diagnosis not present

## 2014-06-24 DIAGNOSIS — R0789 Other chest pain: Secondary | ICD-10-CM | POA: Diagnosis present

## 2014-06-24 DIAGNOSIS — I499 Cardiac arrhythmia, unspecified: Secondary | ICD-10-CM

## 2014-06-24 DIAGNOSIS — I498 Other specified cardiac arrhythmias: Secondary | ICD-10-CM

## 2014-06-24 DIAGNOSIS — I25810 Atherosclerosis of coronary artery bypass graft(s) without angina pectoris: Secondary | ICD-10-CM | POA: Diagnosis present

## 2014-06-24 HISTORY — DX: Anxiety disorder, unspecified: F41.9

## 2014-06-24 HISTORY — DX: Anemia, unspecified: D64.9

## 2014-06-24 HISTORY — DX: Personal history of other medical treatment: Z92.89

## 2014-06-24 HISTORY — DX: Unspecified osteoarthritis, unspecified site: M19.90

## 2014-06-24 LAB — CBC
HCT: 40.2 % (ref 39.0–52.0)
HEMATOCRIT: 39.1 % (ref 39.0–52.0)
Hemoglobin: 14.1 g/dL (ref 13.0–17.0)
Hemoglobin: 13.5 g/dL (ref 13.0–17.0)
MCH: 32.8 pg (ref 26.0–34.0)
MCH: 32.5 pg (ref 26.0–34.0)
MCHC: 35.1 g/dL (ref 30.0–36.0)
MCHC: 34.5 g/dL (ref 30.0–36.0)
MCV: 93.5 fL (ref 78.0–100.0)
MCV: 94 fL (ref 78.0–100.0)
Platelets: 138 10*3/uL — ABNORMAL LOW (ref 150–400)
Platelets: 143 10*3/uL — ABNORMAL LOW (ref 150–400)
RBC: 4.3 MIL/uL (ref 4.22–5.81)
RBC: 4.16 MIL/uL — ABNORMAL LOW (ref 4.22–5.81)
RDW: 12.7 % (ref 11.5–15.5)
RDW: 12.7 % (ref 11.5–15.5)
WBC: 6.7 10*3/uL (ref 4.0–10.5)
WBC: 6.4 10*3/uL (ref 4.0–10.5)

## 2014-06-24 LAB — BASIC METABOLIC PANEL
ANION GAP: 7 (ref 5–15)
BUN: 14 mg/dL (ref 6–23)
CO2: 26 mmol/L (ref 19–32)
CREATININE: 1.05 mg/dL (ref 0.50–1.35)
Calcium: 9 mg/dL (ref 8.4–10.5)
Chloride: 105 mmol/L (ref 96–112)
GFR calc Af Amer: 83 mL/min — ABNORMAL LOW (ref >90)
GFR calc non Af Amer: 71 mL/min — ABNORMAL LOW (ref >90)
Glucose, Bld: 99 mg/dL (ref 70–99)
Potassium: 4.4 mmol/L (ref 3.5–5.1)
Sodium: 138 mmol/L (ref 135–145)

## 2014-06-24 LAB — CREATININE, SERUM
CREATININE: 1.17 mg/dL (ref 0.50–1.35)
GFR calc Af Amer: 73 mL/min — ABNORMAL LOW (ref >90)
GFR calc non Af Amer: 63 mL/min — ABNORMAL LOW (ref >90)

## 2014-06-24 LAB — I-STAT TROPONIN, ED: Troponin i, poc: 0.02 ng/mL (ref 0.00–0.08)

## 2014-06-24 LAB — TROPONIN I: Troponin I: <0.03 ng/mL (ref <0.031)

## 2014-06-24 LAB — TSH: TSH: 2.3 u[IU]/mL (ref 0.350–4.500)

## 2014-06-24 MED ORDER — TRIAMCINOLONE ACETONIDE 55 MCG/ACT NA AERO
1.0000 | INHALATION_SPRAY | Freq: Two times a day (BID) | NASAL | Status: DC
  Filled 2014-06-24: qty 10.8

## 2014-06-24 MED ORDER — ZOLPIDEM TARTRATE 5 MG PO TABS
5.0000 mg | ORAL_TABLET | Freq: Every evening | ORAL | Status: DC | PRN
  Administered 2014-06-24: 5 mg via ORAL
  Filled 2014-06-24: qty 1

## 2014-06-24 MED ORDER — ASPIRIN EC 81 MG PO TBEC
81.0000 mg | DELAYED_RELEASE_TABLET | Freq: Every day | ORAL | Status: DC
  Administered 2014-06-24 – 2014-06-25 (×2): 81 mg via ORAL
  Filled 2014-06-24 (×2): qty 1

## 2014-06-24 MED ORDER — ONDANSETRON HCL 4 MG/2ML IJ SOLN: 4.0000 mg | Freq: Four times a day (QID) | INTRAMUSCULAR | Status: DC | PRN

## 2014-06-24 MED ORDER — ACETAMINOPHEN 325 MG PO TABS: 650.0000 mg | ORAL_TABLET | ORAL | Status: DC | PRN

## 2014-06-24 MED ORDER — NITROGLYCERIN 0.4 MG SL SUBL: 0.4000 mg | SUBLINGUAL_TABLET | SUBLINGUAL | Status: DC | PRN

## 2014-06-24 MED ORDER — ATROPINE SULFATE 0.1 MG/ML IJ SOLN
INTRAMUSCULAR | Status: AC
  Filled 2014-06-24: qty 10

## 2014-06-24 MED ORDER — HEPARIN SODIUM (PORCINE) 5000 UNIT/ML IJ SOLN
5000.0000 [IU] | Freq: Three times a day (TID) | INTRAMUSCULAR | Status: DC
  Filled 2014-06-24 (×4): qty 1

## 2014-06-24 MED ORDER — BENAZEPRIL HCL 40 MG PO TABS
40.0000 mg | ORAL_TABLET | Freq: Every day | ORAL | Status: DC
  Administered 2014-06-25: 40 mg via ORAL
  Filled 2014-06-24: qty 1

## 2014-06-24 MED ORDER — ATORVASTATIN CALCIUM 40 MG PO TABS
40.0000 mg | ORAL_TABLET | Freq: Every day | ORAL | Status: DC
  Administered 2014-06-24 – 2014-06-25 (×2): 40 mg via ORAL
  Filled 2014-06-24 (×2): qty 1

## 2014-06-24 NOTE — ED Notes (Signed)
Called floor for report nurse will call back when available

## 2014-06-24 NOTE — ED Notes (Signed)
Pt was seen at PCP this morning for palpitations and dizziness that has been present x1 week.  Pt sent over by EMS for EKG changes.  Pt denies feeling any palpitations or pain at this time.

## 2014-06-24 NOTE — ED Provider Notes (Signed)
CSN: [id number]     Arrival date & time 06/24/14  1017 History   First MD Initiated Contact with Patient 06/24/14 1019     Chief Complaint  Patient presents with  . Abnormal ECG     (Consider location/radiation/quality/duration/timing/severity/associated sxs/prior Treatment) HPI Comments: Patient with a history of CAD s/p PCI with drug eluting stents in 2013 presents today from his PCP's office due to an abnormal EKG.  EKG showing ventricular bigeminy.  He reports that he has felt lightheaded intermittently and has also had intermittent palpitations over the past couple of days.  He reports that he becomes lightheaded when bending over.  He also reports that he has been monitoring his heart rate and blood pressure at home and has had BP recordings of 97/62 and also a heart rate in the 30's.  He denies chest pain or SOB.  No syncope.   No nausea, vomiting, or diaphoresis.  No fever or chills.  His Cardiologist is Dr Burt Knack.  He is currently on Metoprolol 100 mg daily and reports that he has been taking the medication as directed.  He denies any recent changes to his medications.  The history is provided by the patient.    Past Medical History  Diagnosis Date  . Hypertension   . Hyperlipidemia   . CAD (coronary artery disease)     a. 11/2011 Cath: LM 20 ost, LAD 40 ost, 30-40p, 60-82m, D1 mild plaquing, LCX 70-80 p-m (FFR 0.78, treated with  3.0x20 and 2.75x16 Promus DES), RCA 30-82m, EF 55%, no rwma.;  b.  ETT-Myoview (06/2013):  Ex 7:00, no ST changes, soft tissue atten, no scar or ischemia, EF 62%.   Past Surgical History  Procedure Laterality Date  . Coronary angioplasty with stent placement      8'13-stent placed  . Colonoscopy with propofol N/A 02/20/2013    Procedure: COLONOSCOPY WITH PROPOFOL;  Surgeon: Jeryl Columbia, MD;  Location: [facility] ENDOSCOPY;  Service: Endoscopy;  Laterality: N/A;  . Hot hemostasis N/A 02/20/2013    Procedure: HOT HEMOSTASIS (ARGON PLASMA COAGULATION/BICAP);   Surgeon: Jeryl Columbia, MD;  Location: [facility];  Service: Endoscopy;  Laterality: N/A;  . Left heart catheterization with coronary angiogram N/A 12/02/2011    Procedure: LEFT HEART CATHETERIZATION WITH CORONARY ANGIOGRAM;  Surgeon: Jolaine Artist, MD;  Location: [facility] CATH LAB;  Service: Cardiovascular;  Laterality: N/A;   Family History  Problem Relation Age of Onset  . Heart attack Brother     died @ 35  . Diabetes type I Brother     died @ 77  . Coronary artery disease Brother     alive @ 82  . Other Mother     died @ 74 of "heart issues"  . Pneumonia Father     died @ 33   History  Substance Use Topics  . Smoking status: Never Smoker   . Smokeless tobacco: Not on file  . Alcohol Use: 0.6 oz/week    1 Glasses of wine per week     Comment: occas etoh    Review of Systems  All other systems reviewed and are negative.     Allergies  Codeine  Home Medications   Prior to Admission medications   Medication Sig Start Date End Date Taking? Authorizing Provider  aspirin EC 81 MG tablet Take 81 mg by mouth daily. 12/03/11   Rogelia Mire, NP  atorvastatin (LIPITOR) 40 MG tablet Take 1 tablet (40 mg total) by mouth daily. 08/29/13  10/23/14  Sherren Mocha, MD  benazepril (LOTENSIN) 40 MG tablet Take 40 mg by mouth daily.    Historical Provider, MD  CALCIUM PO Take 1 tablet by mouth daily.    Historical Provider, MD  Glucosamine HCl (GLUCOSAMINE PO) Take 1 tablet by mouth daily.    Historical Provider, MD  metoprolol succinate (TOPROL-XL) 100 MG 24 hr tablet Take 100 mg by mouth daily.     Historical Provider, MD  Multiple Vitamin (MULTIVITAMIN WITH MINERALS) TABS Take 1 tablet by mouth every morning.    Historical Provider, MD  Naproxen Sodium (ALEVE PO) Take 2 tablets by mouth daily as needed (pain).    Historical Provider, MD  nitroGLYCERIN (NITROSTAT) 0.4 MG SL tablet Place 0.4 mg under the tongue every 5 (five) minutes as needed for chest pain.    Historical  Provider, MD  zolpidem (AMBIEN) 10 MG tablet Take 10 mg by mouth at bedtime as needed for sleep.  12/16/11   Historical Provider, MD   BP 120/70 mmHg  Pulse 73  Temp(Src) 97.8 F (36.6 C) (Oral)  Resp 18  Ht 5\' 7"  (1.702 m)  Wt 194 lb (87.998 kg)  BMI 30.38 kg/m2  SpO2 100% Physical Exam  Constitutional: He appears well-developed and well-nourished.  Neck: Neck supple.  Cardiovascular: Normal rate, regular rhythm and normal heart sounds.   Pulses:      Dorsalis pedis pulses are 2+ on the right side, and 2+ on the left side.  Pulmonary/Chest: Effort normal and breath sounds normal.  Abdominal: Soft. Bowel sounds are normal. He exhibits no distension and no mass. There is no tenderness. There is no rebound and no guarding.  Musculoskeletal: Normal range of motion.  No LE edema bilaterally  Neurological: He is alert.  Skin: Skin is warm and dry.  Psychiatric: He has a normal mood and affect.  Nursing note and vitals reviewed.   ED Course  Procedures (including critical care time) Labs Review Labs Reviewed  [place], ED    Imaging Review No results found.   EKG Interpretation   Date/Time:  Monday June 24 2014 10:20:42 EDT Ventricular Rate:  91 PR Interval:  191 QRS Duration: 87 QT Interval:  393 QTC Calculation: 483 R Axis:   12 Text Interpretation:  Sinus or ectopic atrial rhythm Ventricular bigeminy  Anteroseptal infarct, old Bigeminy new Confirmed by Mingo Amber  MD, [place]  ([id number]) on 06/24/2014 10:24:33 AM     10:54 AM Discussed with Cardiology.  They report that the will come see the patient. MDM   Final diagnoses:  None   Patient with a history of CAD s/p PCI with stents presents today with a chief complaint of palpitations and lightheadedness.  EKG showing Ventricular Bigeminy.  Patient is bradycardic, but blood pressure is stable in the ED.  He is asymptomatic at the time of my evaluation.  Labs unremarkable.  Cardiology  consulted and agreed to admit the patient.     Hyman Bible, PA-C 06/24/14 1254  Evelina Bucy, MD 06/24/14 [phone]

## 2014-06-24 NOTE — H&P (Signed)
Patient ID: Randy [name] MRN: [id number], DOB/AGE: 04-28-1945   Admit date: 06/24/2014   Primary Physician: Gerrit Heck, MD Primary Cardiologist: Dr. Burt Knack  Pt. Profile:  70 year old gentleman with known ischemic heart disease was sent to the emergency room by his PCP today because of palpitations and dizziness.  Problem List  Past Medical History  Diagnosis Date  . Hypertension   . Hyperlipidemia   . CAD (coronary artery disease)     a. 11/2011 Cath: LM 20 ost, LAD 40 ost, 30-40p, 60-17m, D1 mild plaquing, LCX 70-80 p-m (FFR 0.78, treated with  3.0x20 and 2.75x16 Promus DES), RCA 30-20m, EF 55%, no rwma.;  b.  ETT-Myoview (06/2013):  Ex 7:00, no ST changes, soft tissue atten, no scar or ischemia, EF 62%.    Past Surgical History  Procedure Laterality Date  . Coronary angioplasty with stent placement      8'13-stent placed  . Colonoscopy with propofol N/A 02/20/2013    Procedure: COLONOSCOPY WITH PROPOFOL;  Surgeon: Jeryl Columbia, MD;  Location: [facility] ENDOSCOPY;  Service: Endoscopy;  Laterality: N/A;  . Hot hemostasis N/A 02/20/2013    Procedure: HOT HEMOSTASIS (ARGON PLASMA COAGULATION/BICAP);  Surgeon: Jeryl Columbia, MD;  Location: [facility];  Service: Endoscopy;  Laterality: N/A;  . Left heart catheterization with coronary angiogram N/A 12/02/2011    Procedure: LEFT HEART CATHETERIZATION WITH CORONARY ANGIOGRAM;  Surgeon: Jolaine Artist, MD;  Location: [facility] CATH LAB;  Service: Cardiovascular;  Laterality: N/A;     Allergies  Allergies  Allergen Reactions  . Codeine Other (See Comments)    Unknown     HPI  This 69 year old gentleman is admitted with awareness of palpitations and dizziness.  He has had slight chest pressure.  He has a past history of known ischemic heart disease and in August 2013 underwent cardiac catheterization.He was found to have significant disease in the left circumflex and he underwent PCI with overlapping drug-eluting stents  guided by FFR. He had moderate mid LAD stenosis and medical therapy was recommended. The patient's left ventricular ejection fraction was preserved at 55%.  The patient had chest discomfort and frequent PVCs and early 2015 and on 06/12/13 underwent an exercise Myoview stress test which showed no ischemia and the ejection fraction was 62%. The patient has had a past history of frequent PVCs.  2 weeks ago he stopped all caffeine in his diet.  He did this mainly because he was experiencing a lot of bloating in his abdomen.  However cutting caffeine out of his diet has not had any significant benefit in terms of reducing his PVCs.  He has continued to be able to walk several miles a day at a relaxed pace without any apparent worsening of his symptoms. The patient has a history of anxiety.  Particularly, he becomes very anxious if he is in any crowd of people.  Home Medications  Prior to Admission medications   Medication Sig Start Date End Date Taking? Authorizing Provider  aspirin EC 81 MG tablet Take 81 mg by mouth daily. 12/03/11   Rogelia Mire, NP  atorvastatin (LIPITOR) 40 MG tablet Take 1 tablet (40 mg total) by mouth daily. 08/29/13 10/23/14  Sherren Mocha, MD  benazepril (LOTENSIN) 40 MG tablet Take 40 mg by mouth daily.    Historical Provider, MD  CALCIUM PO Take 1 tablet by mouth daily.    Historical Provider, MD  Glucosamine HCl (GLUCOSAMINE PO) Take 1 tablet by mouth daily.  Historical Provider, MD  metoprolol succinate (TOPROL-XL) 100 MG 24 hr tablet Take 100 mg by mouth daily.     Historical Provider, MD  Multiple Vitamin (MULTIVITAMIN WITH MINERALS) TABS Take 1 tablet by mouth every morning.    Historical Provider, MD  Naproxen Sodium (ALEVE PO) Take 2 tablets by mouth daily as needed (pain).    Historical Provider, MD  nitroGLYCERIN (NITROSTAT) 0.4 MG SL tablet Place 0.4 mg under the tongue every 5 (five) minutes as needed for chest pain.    Historical Provider, MD  zolpidem  (AMBIEN) 10 MG tablet Take 10 mg by mouth at bedtime as needed for sleep.  12/16/11   Historical Provider, MD    Family History  Family History  Problem Relation Age of Onset  . Heart attack Brother     died @ 41  . Diabetes type I Brother     died @ 101  . Coronary artery disease Brother     alive @ 26  . Other Mother     died @ 39 of "heart issues"  . Pneumonia Father     died @ 92    Social History  History   Social History  . Marital Status: Married    Spouse Name: N/A  . Number of Children: N/A  . Years of Education: N/A   Occupational History  . Not on file.   Social History Main Topics  . Smoking status: Never Smoker   . Smokeless tobacco: Not on file  . Alcohol Use: 0.6 oz/week    1 Glasses of wine per week     Comment: occas etoh  . Drug Use: No  . Sexual Activity: Yes   Other Topics Concern  . Not on file   Social History Narrative   Lives in [place] with wife.  Very active, walking 4-5 miles or playing golf most days of the week.     Review of Systems General:  No chills, fever, night sweats or weight changes.  Cardiovascular:  Positive for palpitations and occasional chest pressure Dermatological: No rash, lesions/masses Respiratory: No cough, dyspnea Urologic: No hematuria, dysuria Abdominal:   No nausea, vomiting, diarrhea, bright red blood per rectum, melena, or hematemesis Neurologic:  No visual changes, wkns, changes in mental status.  Positive for anxiety. All other systems reviewed and are otherwise negative except as noted above.  Physical Exam  Blood pressure 135/77, pulse 107, temperature 97.8 F (36.6 C), temperature source Oral, resp. rate 16, height 5\' 7"  (1.702 m), weight 194 lb (87.998 kg), SpO2 98 %.  General: Pleasant, NAD Psych: Normal affect.  Appears somewhat anxious. Neuro: Alert and oriented X 3. Moves all extremities spontaneously. HEENT: Normal  Neck: Supple without bruits or JVD. Lungs:  Resp regular and unlabored,  CTA. Heart: RRR no s3, s4, or murmurs.  Rhythm is normal sinus rhythm with PVCs in bigeminy. Abdomen: Soft, non-tender, non-distended, BS + x 4.  Extremities: No clubbing, cyanosis or edema. DP/PT/Radials 2+ and equal bilaterally.  Labs  Troponin (Point of Care Test)  Recent Labs  06/24/14 1110  TROPIPOC 0.02   No results for input(s): CKTOTAL, CKMB, TROPONINI in the last 72 hours. Lab Results  Component Value Date   WBC 6.7 06/24/2014   HGB 14.1 06/24/2014   HCT 40.2 06/24/2014   MCV 93.5 06/24/2014   PLT 138* 06/24/2014   No results for input(s): NA, K, CL, CO2, BUN, CREATININE, CALCIUM, PROT, BILITOT, ALKPHOS, ALT, AST, GLUCOSE in the last 168 hours.  Invalid input(s): LABALBU Lab Results  Component Value Date   CHOL 113 08/21/2012   HDL 42.60 08/21/2012   LDLCALC 58 08/21/2012   TRIG 63.0 08/21/2012   No results found for: DDIMER   Radiology/Studies  No results found.  ECG  Jul 01, 2014 10:20:42 [facility]/ED ROUTINE RECORD Sinus or ectopic atrial rhythm Ventricular bigeminy Anteroseptal infarct, old Bigeminy new Confirmed by Mingo Amber MD, [facility] ([id number]) on 07-01-2014 10:24:33 AM  ASSESSMENT AND PLAN  1.  Palpitations and dizziness secondary to frequent PVCs in bigeminy 2.  Ischemic heart disease status post overlapping drug-eluting stents to left circumflex in August 2013.  There was moderate LAD stenosis at that time.  His last nuclear stress test was one year ago. 3.  Anxiety 4.  Essential hypertension 5.  Hyperlipidemia   Disposition: We will admit for observation.  Obtain serial troponins.  Obtain echocardiogram.  Anticipate probable treadmill Myoview in a.m. His resting heart rate is slow and we will cut back on his Toprol to just 50 mg daily.  He has already taken his 100 mg Toprol today.  We will check thyroid function studies.  Signed, Darlin Coco, MD  2014-07-01, 11:29 AM

## 2014-06-25 ENCOUNTER — Other Ambulatory Visit: Payer: Self-pay

## 2014-06-25 ENCOUNTER — Other Ambulatory Visit (HOSPITAL_COMMUNITY): Payer: Medicare FFS

## 2014-06-25 ENCOUNTER — Observation Stay (HOSPITAL_COMMUNITY): Payer: Medicare PPO

## 2014-06-25 ENCOUNTER — Encounter (HOSPITAL_COMMUNITY): Payer: Self-pay | Admitting: Nurse Practitioner

## 2014-06-25 DIAGNOSIS — I2510 Atherosclerotic heart disease of native coronary artery without angina pectoris: Secondary | ICD-10-CM | POA: Diagnosis not present

## 2014-06-25 DIAGNOSIS — R079 Chest pain, unspecified: Secondary | ICD-10-CM

## 2014-06-25 DIAGNOSIS — I259 Chronic ischemic heart disease, unspecified: Secondary | ICD-10-CM | POA: Diagnosis not present

## 2014-06-25 DIAGNOSIS — R072 Precordial pain: Secondary | ICD-10-CM

## 2014-06-25 DIAGNOSIS — I10 Essential (primary) hypertension: Secondary | ICD-10-CM | POA: Diagnosis not present

## 2014-06-25 LAB — T4, FREE: Free T4: 1.11 ng/dL (ref 0.80–1.80)

## 2014-06-25 MED ORDER — TECHNETIUM TC 99M SESTAMIBI GENERIC - CARDIOLITE
30.0000 | Freq: Once | INTRAVENOUS | Status: AC | PRN
  Administered 2014-06-25: 30 via INTRAVENOUS

## 2014-06-25 MED ORDER — PERFLUTREN LIPID MICROSPHERE
1.0000 mL | INTRAVENOUS | Status: AC | PRN
  Administered 2014-06-25: 2 mL via INTRAVENOUS
  Filled 2014-06-25: qty 10

## 2014-06-25 MED ORDER — TECHNETIUM TC 99M SESTAMIBI GENERIC - CARDIOLITE
10.0000 | Freq: Once | INTRAVENOUS | Status: AC | PRN
  Administered 2014-06-25: 10 via INTRAVENOUS

## 2014-06-25 MED ORDER — METOPROLOL SUCCINATE ER 50 MG PO TB24: 50.0000 mg | ORAL_TABLET | Freq: Every day | ORAL | Status: DC

## 2014-06-25 NOTE — Progress Notes (Signed)
   Randy [name] presented for a exercise cardiolite today.  No immediate complications.  Stress imaging pending.  Murray Hodgkins, NP 06/25/2014, 1:39 PM

## 2014-06-25 NOTE — Discharge Instructions (Signed)
***  PLEASE REMEMBER TO BRING ALL OF YOUR MEDICATIONS TO EACH OF YOUR FOLLOW-UP OFFICE VISITS.  

## 2014-06-25 NOTE — Progress Notes (Signed)
UR completed 

## 2014-06-25 NOTE — Progress Notes (Signed)
Patient with heart rate mid 30s-40's. Patient asymptomatic. NP Merrilee Jansky called - order received for a 12-lead. Will obtain and continue to monitor.  -Soyla Dryer, RN

## 2014-06-25 NOTE — Progress Notes (Signed)
Echocardiogram 2D Echocardiogram with Definity has been performed.  Leigh Blas 06/25/2014, 9:56 AM

## 2014-06-25 NOTE — Progress Notes (Signed)
    Subjective:  Feels ok this am. No CP or dyspnea.   Objective:  Vital Signs in the last 24 hours: Temp:  [97.8 F (36.6 C)-98 F (36.7 C)] 97.9 F (36.6 C) (03/22 0533) Pulse Rate:  [40-108] 57 (03/22 0533) Resp:  [14-24] 16 (03/22 0533) BP: (109-163)/(37-82) 139/82 mmHg (03/22 0533) SpO2:  [96 %-100 %] 100 % (03/22 0533) Weight:  [191 lb 12.8 oz (87 kg)-194 lb (87.998 kg)] 191 lb 12.8 oz (87 kg) (03/21 1656)  Intake/Output from previous day: 03/21 0701 - 03/22 0700 In: 120 [P.O.:120] Out: 500 [Urine:500]  Physical Exam: Pt is alert and oriented, NAD HEENT: normal Neck: JVP - normal Lungs: CTA bilaterally CV: RRR without murmur or gallop Abd: soft, NT, Positive BS, no hepatomegaly Ext: no C/C/E, distal pulses intact and equal Skin: warm/dry no rash   Lab Results:  Recent Labs  06/24/14 1040 06/24/14 1913  WBC 6.7 6.4  HGB 14.1 13.5  PLT 138* 143*    Recent Labs  06/24/14 1040 06/24/14 1913  NA 138  --   K 4.4  --   CL 105  --   CO2 26  --   GLUCOSE 99  --   BUN 14  --   CREATININE 1.05 1.17    Recent Labs  06/24/14 1600 06/24/14 1913  TROPONINI <0.03 <0.03   Tele: Personally reviewed: sinus rhythm  Assessment/Plan:  1. Palpitations and dizziness secondary to frequent PVCs in bigeminy 2. Ischemic heart disease status post overlapping drug-eluting stents to left circumflex in August 2013. There was moderate LAD stenosis at that time. His last nuclear stress test was one year ago. 3. Anxiety 4. Essential hypertension 5. Hyperlipidemia  Plan of Dr Mare Ferrari reviewed and agree. Pt for Myoview and echo today. Suspect he's having symptomatic PVC's. Also with significant issues related to anxiety - I asked him to address this with his PCP. Likely d/c today if stress test and echo are low-risk.   Sherren Mocha, M.D. 06/25/2014, 8:22 AM

## 2014-06-25 NOTE — Discharge Summary (Signed)
Discharge Summary   Patient ID: Randy [name],  MRN: [id number], DOB/AGE: Aug 06, 1945 69 y.o.  Admit date: 06/24/2014 Discharge date: 06/25/2014  Primary Care Provider: Gerrit Heck Primary Cardiologist: Jerilynn Mages. Burt Knack, MD   Discharge Diagnoses Principal Problem:   Midsternal chest pain  **Negative Myoview this admission. Active Problems:   CAD (coronary artery disease)    HTN (hypertension)   Hyperlipidemia   Allergies Allergies  Allergen Reactions  . Codeine Other (See Comments)    Unknown     Procedures  Exercise Cardiolite 3.22.2016  IMPRESSION: 1. No reversible ischemia or infarction.   2. Normal left ventricular wall motion.   3. Left ventricular ejection fraction 56%   4. Low-risk stress test findings*. _____________  2D Echocardiogram 3.22.2016  Study Conclusions  - Left ventricle: The cavity size was normal. Wall thickness was   normal. Systolic function was normal. The estimated ejection   fraction was in the range of 55% to 60%. Wall motion was normal;   there were no regional wall motion abnormalities. Doppler   parameters are consistent with abnormal left ventricular   relaxation (grade 1 diastolic dysfunction). _____________   History of Present Illness  69 year old male with a prior history of coronary artery disease status post percutaneous intervention upon the left circumflex in August 2013. He presented to the [facility] ED on March 21 with complaints of slight chest pressure, palpitations, and dizziness. ECG was nonacute and troponin was normal. He was admitted for further evaluation.  Hospital Course  Following admission, he had no further chest pain. He ruled out for myocardial infarction. His beta blocker was held in preparation for stress testing. He was also relatively bradycardic although asymptomatic. Stress testing was undertaken and he walked for 9 minutes, achieving his target heart rate without acute ST or T changes.  Imaging revealed no evidence of ischemia or infarct. Echocardiography was also carried out and revealed normal LV function with grade 1 diastolic dysfunction. As a result of his studies, he will be discharged home today in good condition. We have reduced his beta blocker dose to 50 mg daily in the setting of baseline bradycardia with rates in the 40s at times. Palpitations are felt to be secondary to symptomatic PVCs.  Discharge Vitals Blood pressure 143/75, pulse 90, temperature 97.9 F (36.6 C), temperature source Oral, resp. rate 16, height 5\' 7"  (1.702 m), weight 191 lb 12.8 oz (87 kg), SpO2 100 %.  Filed Weights   06/24/14 1020 06/24/14 1656  Weight: 194 lb (87.998 kg) 191 lb 12.8 oz (87 kg)   Labs  CBC  Recent Labs  06/24/14 1040 06/24/14 1913  WBC 6.7 6.4  HGB 14.1 13.5  HCT 40.2 39.1  MCV 93.5 94.0  PLT 138* 573*   Basic Metabolic Panel  Recent Labs  06/24/14 1040 06/24/14 1913  NA 138  --   K 4.4  --   CL 105  --   CO2 26  --   GLUCOSE 99  --   BUN 14  --   CREATININE 1.05 1.17  CALCIUM 9.0  --    Cardiac Enzymes  Recent Labs  06/24/14 1600 06/24/14 1913  TROPONINI <0.03 <0.03   Thyroid Function Tests  Recent Labs  06/24/14 1913  TSH 2.300    Disposition  Pt is being discharged home today in good condition.  Follow-up Plans & Appointments      Follow-up Information    Follow up with Sherren Mocha, MD.   Specialty:  Cardiology  Why:  1-2 months.   Contact information:   [address] [place] [address] [phone]       Follow up with Gerrit Heck, MD.   Specialty:  Family Medicine   Why:  as scheduled.   Contact information:   [place] [address] [phone]       Discharge Medications    Medication List    TAKE these medications        ALEVE PO  Take 2 tablets by mouth daily as needed (pain).     aspirin EC 81 MG tablet  Take 81 mg by mouth daily.      atorvastatin 40 MG tablet  Commonly known as:  LIPITOR  Take 1 tablet (40 mg total) by mouth daily.     benazepril 40 MG tablet  Commonly known as:  LOTENSIN  Take 40 mg by mouth daily.     CALCIUM PO  Take 1 tablet by mouth daily.     GLUCOSAMINE PO  Take 1 tablet by mouth daily.     metoprolol succinate 50 MG 24 hr tablet  Commonly known as:  TOPROL-XL  Take 1 tablet (50 mg total) by mouth daily.     multivitamin with minerals Tabs tablet  Take 1 tablet by mouth every morning.     nitroGLYCERIN 0.4 MG SL tablet  Commonly known as:  NITROSTAT  Place 0.4 mg under the tongue every 5 (five) minutes as needed for chest pain.     zolpidem 10 MG tablet  Commonly known as:  AMBIEN  Take 10 mg by mouth at bedtime as needed for sleep.        Outstanding Labs/Studies  None  Duration of Discharge Encounter   Greater than 30 minutes including physician time.  Signed, Murray Hodgkins NP 06/25/2014, 6:19 PM

## 2014-08-23 ENCOUNTER — Other Ambulatory Visit: Payer: Self-pay | Admitting: Cardiovascular Disease

## 2014-08-26 NOTE — Telephone Encounter (Signed)
Per note 3.22.16 

## 2014-08-30 ENCOUNTER — Encounter: Payer: Self-pay | Admitting: Cardiovascular Disease

## 2014-08-30 ENCOUNTER — Ambulatory Visit (INDEPENDENT_AMBULATORY_CARE_PROVIDER_SITE_OTHER): Payer: Medicare PPO | Admitting: Cardiovascular Disease

## 2014-08-30 VITALS — BP 140/80 | HR 60 | Ht 67.0 in | Wt 186.4 lb

## 2014-08-30 DIAGNOSIS — E785 Hyperlipidemia, unspecified: Secondary | ICD-10-CM

## 2014-08-30 DIAGNOSIS — I25810 Atherosclerosis of coronary artery bypass graft(s) without angina pectoris: Secondary | ICD-10-CM | POA: Diagnosis not present

## 2014-08-30 DIAGNOSIS — I10 Essential (primary) hypertension: Secondary | ICD-10-CM | POA: Diagnosis not present

## 2014-08-30 NOTE — Progress Notes (Signed)
Cardiology Office Note   Date:  08/30/2014   ID:  Randy, [name] [birth year], MRN [id number]  PCP:  Gerrit Heck, MD  Cardiologist:  Sherren Mocha, MD    Chief Complaint  Patient presents with  . Palpitations   History of Present Illness: Randy [name] is a 69 y.o. male who presents for follow-up evaluation. The patient has coronary artery disease and he presented in August of 2013 with unstable angina. He was found to have significant disease in the left circumflex and he underwent PCI with overlapping drug-eluting stents guided by FFR. He had moderate mid LAD stenosis and medical therapy was recommended. The patient's left ventricular ejection fraction was preserved at 55%.  Overall he's doing ok. Still playing a lot of golf. No exertional symptoms. He does have concerns about heart palpitations - he has a history of PVC's. Also note am BP readings have been elevated averaging about 160/80 mmHg. During the day, his BP has been very good. No exertional CP or dyspnea. Has occasional chest pain associated with palpitations.   Past Medical History  Diagnosis Date  . Hypertension   . Hyperlipidemia   . CAD (coronary artery disease)     a. 11/2011 Cath: LM 20 ost, LAD 40 ost, 30-40p, 60-17m, D1 mild plaquing, LCX 70-80 p-m (FFR 0.78, treated with  3.0x20 and 2.75x16 Promus DES), RCA 30-76m, EF 55%, no rwma.;  b.  ETT-Myoview (06/2013):  Ex 7:00, no ST changes, soft tissue atten, no scar or ischemia, EF 62%;  06/2014 MV: EF 56%, no isch/infarct.  . Anemia   . Arthritis     "hands, knees, shoulders" (06/24/2014)  . Anxiety   . H/O echocardiogram     a. 06/2014 Echo: EF 55-60%, Gr 1 DD.    Past Surgical History  Procedure Laterality Date  . Colonoscopy with propofol N/A 02/20/2013    Procedure: COLONOSCOPY WITH PROPOFOL;  Surgeon: Jeryl Columbia, MD;  Location: [facility] ENDOSCOPY;  Service: Endoscopy;  Laterality: N/A;  . Hot hemostasis N/A 02/20/2013    Procedure: HOT  HEMOSTASIS (ARGON PLASMA COAGULATION/BICAP);  Surgeon: Jeryl Columbia, MD;  Location: [facility];  Service: Endoscopy;  Laterality: N/A;  . Left heart catheterization with coronary angiogram N/A 12/02/2011    Procedure: LEFT HEART CATHETERIZATION WITH CORONARY ANGIOGRAM;  Surgeon: Jolaine Artist, MD;  Location: [facility] CATH LAB;  Service: Cardiovascular;  Laterality: N/A;  . Coronary angioplasty with stent placement  11/2011    "circumflex"  . Wisdom tooth extraction      Current Outpatient Prescriptions  Medication Sig Dispense Refill  . ALPRAZolam (XANAX) 0.25 MG tablet Take 0.25 mg by mouth 2 (two) times daily as needed.  0  . aspirin EC 81 MG tablet Take 81 mg by mouth daily.    [name] atorvastatin (LIPITOR) 40 MG tablet TAKE 1 TABLET EVERY DAY 90 tablet 3  . benazepril (LOTENSIN) 40 MG tablet Take 40 mg by mouth daily.    [name] CALCIUM PO Take 1 tablet by mouth daily.    . Glucosamine HCl (GLUCOSAMINE PO) Take 1 tablet by mouth daily.    . metoprolol succinate (TOPROL-XL) 50 MG 24 hr tablet Take 1 tablet (50 mg total) by mouth daily. 30 tablet 6  . Multiple Vitamin (MULTIVITAMIN WITH MINERALS) TABS Take 1 tablet by mouth every morning.    . Naproxen Sodium (ALEVE PO) Take 2 tablets by mouth daily as needed (pain).    . nitroGLYCERIN (NITROSTAT) 0.4 MG SL tablet Place  0.4 mg under the tongue every 5 (five) minutes as needed for chest pain.    [name] zolpidem (AMBIEN) 10 MG tablet Take 10 mg by mouth at bedtime as needed for sleep.      No current facility-administered medications for this visit.    Allergies:   Codeine   Social History:  The patient  reports that he has never smoked. He has quit using smokeless tobacco. His smokeless tobacco use included Chew. He reports that he drinks alcohol. He reports that he does not use illicit drugs.   Family History:  The patient's family history includes Coronary artery disease in his brother; Diabetes type I in his brother and mother; Heart attack in his  brother; Other in his mother; Pneumonia in his father.    ROS:  Please see the history of present illness.  Otherwise, review of systems is positive for muscle pain, dizziness, chest pressure, irregular heartbeats, snoring, anxiety.  All other systems are reviewed and negative.    PHYSICAL EXAM: VS:  BP 140/80 mmHg  Pulse 60  Ht 5\' 7"  (1.702 m)  Wt 186 lb 6.4 oz (84.55 kg)  BMI 29.19 kg/m2 , BMI Body mass index is 29.19 kg/(m^2). GEN: Well nourished, well developed, in no acute distress HEENT: normal Neck: no JVD, no masses. No carotid bruits Cardiac: RRR without murmur or gallop                Respiratory:  clear to auscultation bilaterally, normal work of breathing GI: soft, nontender, nondistended, + BS MS: no deformity or atrophy Ext: no pretibial edema, pedal pulses 2+= bilaterally Skin: warm and dry, no rash Neuro:  Strength and sensation are intact Psych: euthymic mood, full affect  EKG:  EKG is ordered today. The ekg ordered today shows normal sinus rhythm 60 bpm, within normal limits.  Recent Labs: 06/24/2014: BUN 14; Creatinine 1.17; Hemoglobin 13.5; Platelets 143*; Potassium 4.4; Sodium 138; TSH 2.300   Lipid Panel     Component Value Date/Time   CHOL 113 08/21/2012 0744   TRIG 63.0 08/21/2012 0744   HDL 42.60 08/21/2012 0744   CHOLHDL 3 08/21/2012 0744   VLDL 12.6 08/21/2012 0744   LDLCALC 58 08/21/2012 0744      Wt Readings from Last 3 Encounters:  08/30/14 186 lb 6.4 oz (84.55 kg)  06/24/14 191 lb 12.8 oz (87 kg)  08/29/13 192 lb (87.091 kg)     Cardiac Studies Reviewed: 2D Echo 3.22.2016: Study Conclusions  - Left ventricle: The cavity size was normal. Wall thickness was normal. Systolic function was normal. The estimated ejection fraction was in the range of 55% to 60%. Wall motion was normal; there were no regional wall motion abnormalities. Doppler parameters are consistent with abnormal left ventricular relaxation (grade 1 diastolic  dysfunction).  Myoview 3.22.2016: IMPRESSION: 1. No reversible ischemia or infarction.  2. Normal left ventricular wall motion.  3. Left ventricular ejection fraction 56%  4. Low-risk stress test findings*.  ASSESSMENT AND PLAN: 1.  CAD, native vessel: The patient has no symptoms of angina. His only chest discomfort is associated with heart palpitations. He has no exertional symptoms. He will continue on his current medical program which includes a beta blocker, statin drug, aspirin, and ACE inhibitor.  2. Essential hypertension, uncontrolled: Morning blood pressures are running too high. It sounds like blood pressures during the day are within range. I asked him to move his metoprolol succinate to bedtime. He will monitor blood pressure 3 days per week  and call us in a few weeks with readings. If BP remains elevated, we could either increase his metoprolol succinate or add a low-dose erratic.  3. Hyperlipidemia: The patient takes atorvastatin 40 mg daily. Lipids are followed by his PCP. We discussed continued emphasis on diet, exercise, and weight loss. He has done well over the past year with approximately 15# weight loss.   Current medicines are reviewed with the patient today.  The patient does not have concerns regarding medicines.  Labs/ tests ordered today include:  No orders of the defined types were placed in this encounter.    Disposition:   FU one year  Signed, Sherren Mocha, MD  08/30/2014 2:24 PM    [address], [place], [place]  [address] Phone: [phone]; Fax: [phone]

## 2014-08-30 NOTE — Patient Instructions (Signed)
Medication Instructions:  Your physician has recommended you make the following change in your medication:  Please change your Metoprolol Succinate dosage to bedtime.   Labwork: No new orders.   Testing/Procedures: No new orders.   Follow-Up: Your physician wants you to follow-up in: 1 YEAR with Dr Burt Knack. You will receive a reminder letter in the mail two months in advance. If you don't receive a letter, please call our office to schedule the follow-up appointment.   Any Other Special Instructions Will Be Listed Below (If Applicable).  Your physician has requested that you regularly monitor and record your blood pressure readings at home three times a week. Please use the same machine at the same time of day to check your readings and record them to bring to your follow-up visit.  Please call the office in 3 WEEKS with your BP readings.

## 2014-09-12 ENCOUNTER — Encounter: Payer: Self-pay | Admitting: Cardiovascular Disease

## 2014-09-24 DIAGNOSIS — Z1283 Encounter for screening for malignant neoplasm of skin: Secondary | ICD-10-CM | POA: Diagnosis not present

## 2014-09-24 DIAGNOSIS — L570 Actinic keratosis: Secondary | ICD-10-CM | POA: Diagnosis not present

## 2014-09-24 DIAGNOSIS — X32XXXD Exposure to sunlight, subsequent encounter: Secondary | ICD-10-CM | POA: Diagnosis not present

## 2014-12-30 DIAGNOSIS — Z955 Presence of coronary angioplasty implant and graft: Secondary | ICD-10-CM | POA: Diagnosis not present

## 2014-12-30 DIAGNOSIS — E780 Pure hypercholesterolemia: Secondary | ICD-10-CM | POA: Diagnosis not present

## 2014-12-30 DIAGNOSIS — Z79899 Other long term (current) drug therapy: Secondary | ICD-10-CM | POA: Diagnosis not present

## 2014-12-30 DIAGNOSIS — I2510 Atherosclerotic heart disease of native coronary artery without angina pectoris: Secondary | ICD-10-CM | POA: Diagnosis not present

## 2014-12-30 DIAGNOSIS — Z0000 Encounter for general adult medical examination without abnormal findings: Secondary | ICD-10-CM | POA: Diagnosis not present

## 2014-12-30 DIAGNOSIS — I10 Essential (primary) hypertension: Secondary | ICD-10-CM | POA: Diagnosis not present

## 2014-12-30 DIAGNOSIS — G4700 Insomnia, unspecified: Secondary | ICD-10-CM | POA: Diagnosis not present

## 2014-12-30 DIAGNOSIS — R718 Other abnormality of red blood cells: Secondary | ICD-10-CM | POA: Diagnosis not present

## 2014-12-30 DIAGNOSIS — Z23 Encounter for immunization: Secondary | ICD-10-CM | POA: Diagnosis not present

## 2015-02-05 ENCOUNTER — Other Ambulatory Visit: Payer: Self-pay

## 2015-02-05 MED ORDER — METOPROLOL SUCCINATE ER 50 MG PO TB24: 50.0000 mg | ORAL_TABLET | Freq: Every day | ORAL | Status: AC

## 2015-03-04 DIAGNOSIS — M25511 Pain in right shoulder: Secondary | ICD-10-CM | POA: Diagnosis not present

## 2015-03-13 DIAGNOSIS — M25561 Pain in right knee: Secondary | ICD-10-CM | POA: Diagnosis not present

## 2015-04-28 IMAGING — DX DG CHEST 1V PORT
1 series · 1 of 1 positions shown · non-contrast
Comparison: 12/02/2011

CLINICAL DATA: Irregular heart rate.  Hypertension.

EXAM:
PORTABLE CHEST - 1 VIEW

[portable]
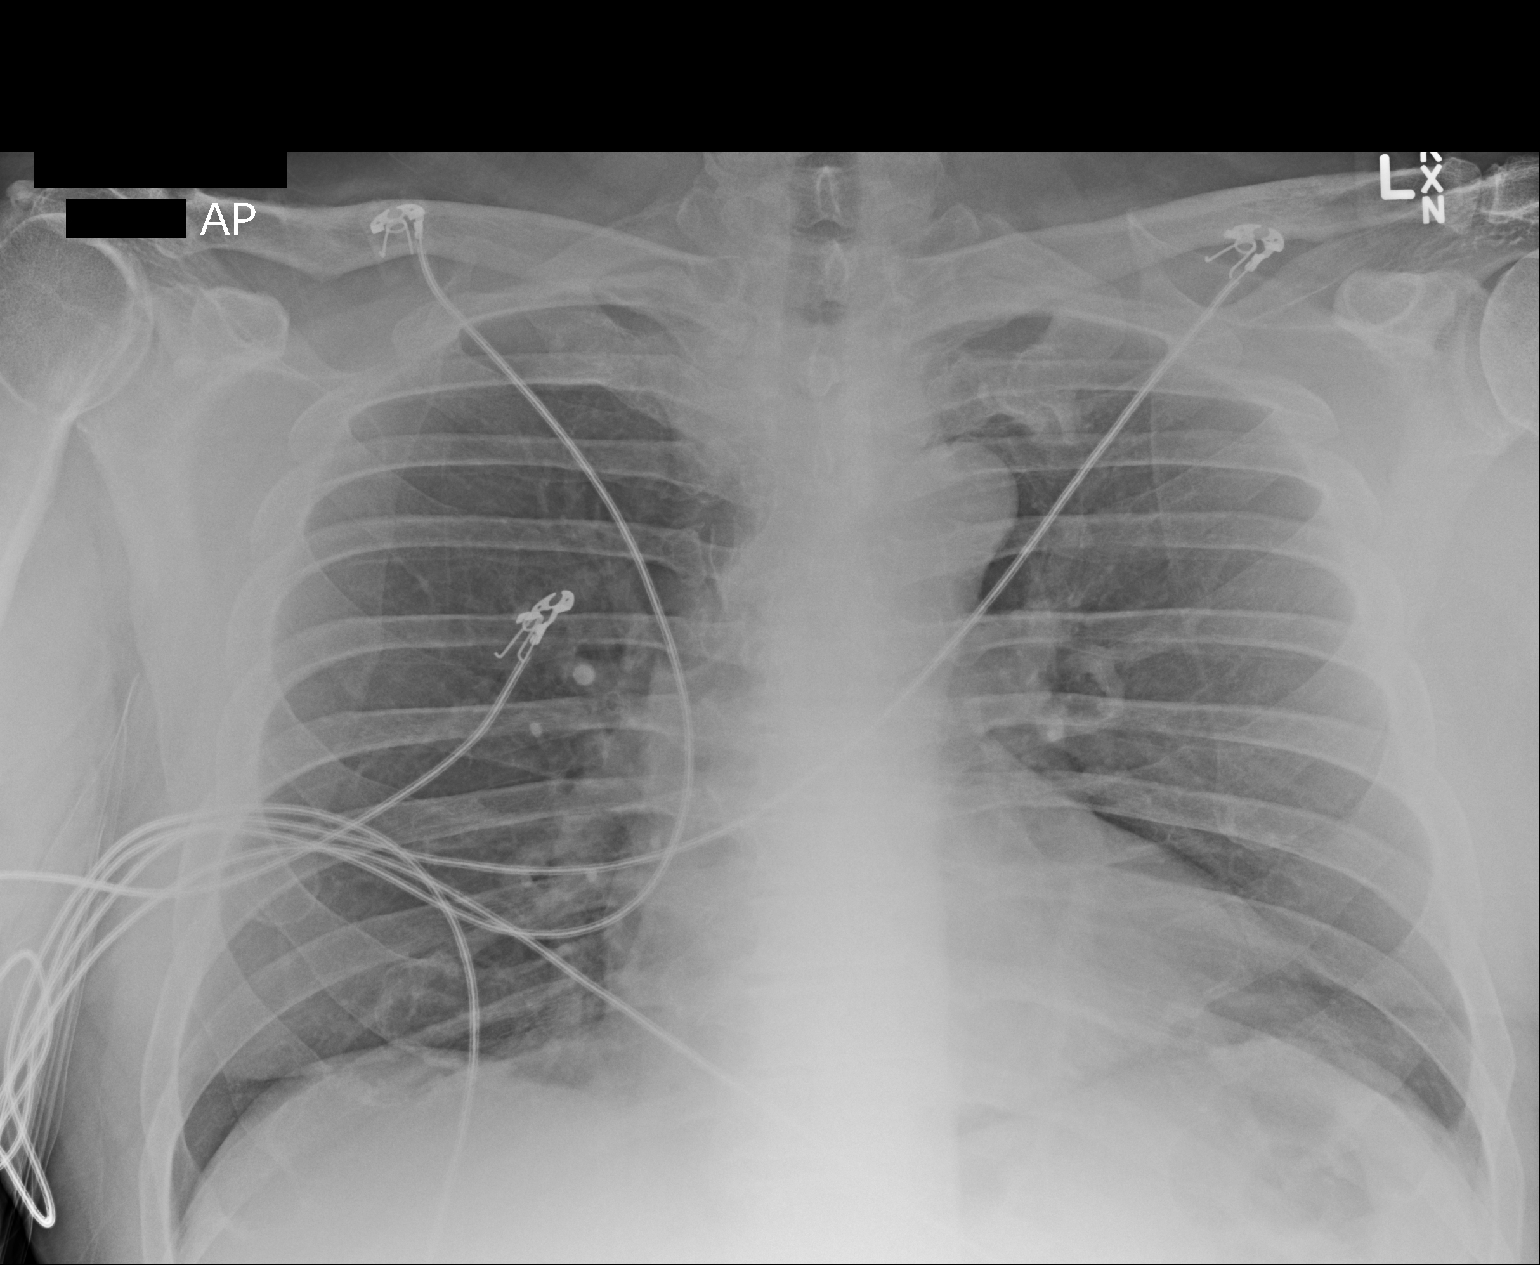

[1 of 1 positions shown; findings below may reference images not displayed]

FINDINGS: The heart size and mediastinal contours are within normal limits.
Both lungs are clear. The visualized skeletal structures are
unremarkable.
IMPRESSION: No active disease.

## 2015-07-22 ENCOUNTER — Other Ambulatory Visit: Payer: Self-pay | Admitting: *Deleted

## 2015-07-22 MED ORDER — ATORVASTATIN CALCIUM 40 MG PO TABS: 40.0000 mg | ORAL_TABLET | Freq: Every day | ORAL | Status: DC

## 2015-09-08 ENCOUNTER — Encounter: Payer: Self-pay | Admitting: Cardiovascular Disease

## 2015-09-08 ENCOUNTER — Other Ambulatory Visit: Payer: Self-pay | Admitting: Cardiovascular Disease

## 2015-09-10 NOTE — Telephone Encounter (Signed)
Per last office visit, lipids are follow by pcp. Ok to refill or defer to their office? Please advise. Thanks, MI

## 2015-09-10 NOTE — Telephone Encounter (Signed)
Okay to refill? 

## 2015-09-16 ENCOUNTER — Ambulatory Visit (INDEPENDENT_AMBULATORY_CARE_PROVIDER_SITE_OTHER): Payer: Medicare Other | Admitting: Cardiovascular Disease

## 2015-09-16 ENCOUNTER — Encounter: Payer: Self-pay | Admitting: Cardiovascular Disease

## 2015-09-16 VITALS — BP 160/90 | HR 68 | Ht 67.0 in | Wt 194.1 lb

## 2015-09-16 DIAGNOSIS — I10 Essential (primary) hypertension: Secondary | ICD-10-CM

## 2015-09-16 MED ORDER — ATORVASTATIN CALCIUM 40 MG PO TABS: ORAL_TABLET | ORAL | Status: DC

## 2015-09-16 NOTE — Patient Instructions (Signed)
Your physician recommends that you continue on your current medications as directed. Please refer to the Current Medication list given to you today.  Your physician wants you to follow-up in: 1 year with Dr. Burt Knack.  You will receive a reminder letter in the mail two months in advance. If you don't receive a letter, please call our office to schedule the follow-up appointment.  Please monitor your blood pressure at home a few days per week and call if readings are greater than 140/90.

## 2015-09-16 NOTE — Progress Notes (Signed)
Cardiology Office Note Date:  09/16/2015   ID:  Randy [name], DOB [birth year], MRN [id number]  PCP:  Gerrit Heck, MD  Cardiologist:  Sherren Mocha, MD    Chief Complaint  Patient presents with  . Coronary Artery Disease  . Hypertension   History of Present Illness: Randy [name] is a 70 y.o. male who presents for follow-up evaluation. The patient has coronary artery disease and he presented in 2013 with unstable angina. He was found to have significant disease in the left circumflex and he underwent PCI with overlapping drug-eluting stents guided by FFR. He had moderate mid LAD stenosis and medical therapy was recommended. The patient's left ventricular ejection fraction was preserved at 55%.  BP's have been running in a good range with rare exception. At PCP's office recently BP was 124/68 mmHg. Today, he denies symptoms of palpitations, chest pain, shortness of breath, orthopnea, PND, lower extremity edema, dizziness, or syncope.  Past Medical History  Diagnosis Date  . Hypertension   . Hyperlipidemia   . CAD (coronary artery disease)     a. 11/2011 Cath: LM 20 ost, LAD 40 ost, 30-40p, 60-52m, D1 mild plaquing, LCX 70-80 p-m (FFR 0.78, treated with  3.0x20 and 2.75x16 Promus DES), RCA 30-70m, EF 55%, no rwma.;  b.  ETT-Myoview (06/2013):  Ex 7:00, no ST changes, soft tissue atten, no scar or ischemia, EF 62%;  06/2014 MV: EF 56%, no isch/infarct.  . Anemia   . Arthritis     "hands, knees, shoulders" (06/24/2014)  . Anxiety   . H/O echocardiogram     a. 06/2014 Echo: EF 55-60%, Gr 1 DD.    Past Surgical History  Procedure Laterality Date  . Colonoscopy with propofol N/A 02/20/2013    Procedure: COLONOSCOPY WITH PROPOFOL;  Surgeon: Jeryl Columbia, MD;  Location: [facility] ENDOSCOPY;  Service: Endoscopy;  Laterality: N/A;  . Hot hemostasis N/A 02/20/2013    Procedure: HOT HEMOSTASIS (ARGON PLASMA COAGULATION/BICAP);  Surgeon: Jeryl Columbia, MD;  Location: [facility];   Service: Endoscopy;  Laterality: N/A;  . Left heart catheterization with coronary angiogram N/A 12/02/2011    Procedure: LEFT HEART CATHETERIZATION WITH CORONARY ANGIOGRAM;  Surgeon: Jolaine Artist, MD;  Location: [facility] CATH LAB;  Service: Cardiovascular;  Laterality: N/A;  . Coronary angioplasty with stent placement  11/2011    "circumflex"  . Wisdom tooth extraction      Current Outpatient Prescriptions  Medication Sig Dispense Refill  . ALPRAZolam (XANAX) 0.25 MG tablet Take 0.25 mg by mouth 2 (two) times daily as needed for anxiety.   0  . aspirin EC 81 MG tablet Take 81 mg by mouth daily.    [name] atorvastatin (LIPITOR) 40 MG tablet Take 1 tablet by mouth  daily 90 tablet 3  . benazepril (LOTENSIN) 40 MG tablet Take 40 mg by mouth daily.    [name] CALCIUM PO Take 1 tablet by mouth daily.    . Glucosamine HCl (GLUCOSAMINE PO) Take 1 tablet by mouth daily.    . metoprolol succinate (TOPROL-XL) 50 MG 24 hr tablet Take 1 tablet (50 mg total) by mouth daily. 90 tablet 3  . Multiple Vitamin (MULTIVITAMIN WITH MINERALS) TABS Take 1 tablet by mouth every morning.    . Naproxen Sodium (ALEVE PO) Take 2 tablets by mouth daily as needed (pain).    . nitroGLYCERIN (NITROSTAT) 0.4 MG SL tablet Place 0.4 mg under the tongue every 5 (five) minutes as needed for chest pain.    [name]  zolpidem (AMBIEN) 10 MG tablet Take 10 mg by mouth at bedtime as needed for sleep.      No current facility-administered medications for this visit.    Allergies:   Codeine   Social History:  The patient  reports that he has never smoked. He has quit using smokeless tobacco. His smokeless tobacco use included Chew. He reports that he drinks alcohol. He reports that he does not use illicit drugs.   Family History:  The patient's  family history includes Coronary artery disease in his brother; Diabetes type I in his brother and mother; Heart attack in his brother; Other in his mother; Pneumonia in his father.   ROS:  Please see the  history of present illness.  Otherwise, review of systems is positive for Muscle pain, snoring, anxiety.  All other systems are reviewed and negative.   PHYSICAL EXAM: VS:  BP 160/90 mmHg  Pulse 68  Ht 5\' 7"  (1.702 m)  Wt 194 lb 1.9 oz (88.052 kg)  BMI 30.40 kg/m2 , BMI Body mass index is 30.4 kg/(m^2).  Blood pressure on my recheck is 140/95 GEN: Well nourished, well developed, in no acute distress HEENT: normal Neck: no JVD, no masses. No carotid bruits Cardiac: RRR without murmur or gallop                Respiratory:  clear to auscultation bilaterally, normal work of breathing GI: soft, nontender, nondistended, + BS MS: no deformity or atrophy Ext: no pretibial edema, pedal pulses 2+= bilaterally Skin: warm and dry, no rash Neuro:  Strength and sensation are intact Psych: euthymic mood, full affect  EKG:  EKG is ordered today. The ekg ordered today shows normal sinus rhythm 70 bpm, within normal limits.  Recent Labs: No results found for requested labs within last 365 days.   Lipid Panel     Component Value Date/Time   CHOL 113 08/21/2012 0744   TRIG 63.0 08/21/2012 0744   HDL 42.60 08/21/2012 0744   CHOLHDL 3 08/21/2012 0744   VLDL 12.6 08/21/2012 0744   LDLCALC 58 08/21/2012 0744      Wt Readings from Last 3 Encounters:  09/16/15 194 lb 1.9 oz (88.052 kg)  08/30/14 186 lb 6.4 oz (84.55 kg)  06/24/14 191 lb 12.8 oz (87 kg)     Cardiac Studies Reviewed: 2D Echo 3.22.2016: Study Conclusions  - Left ventricle: The cavity size was normal. Wall thickness was normal. Systolic function was normal. The estimated ejection fraction was in the range of 55% to 60%. Wall motion was normal; there were no regional wall motion abnormalities. Doppler parameters are consistent with abnormal left ventricular relaxation (grade 1 diastolic dysfunction).  Myoview 3.22.2016: IMPRESSION: 1. No reversible ischemia or infarction.  2. Normal left ventricular wall  motion.  3. Left ventricular ejection fraction 56%  4. Low-risk stress test findings*.  ASSESSMENT AND PLAN: 1.  CAD, native vessel: no angina. Medications reviewed and he will continue on aspirin and a statin drug. I will see him back in one year.  2. Essential HTN: Blood pressure above goal today. However, he feels anxious and clearly has a component of whitecoat hypertension. Blood pressure recently with his PCP was normal. Asked him to monitor blood pressure at home a few days per week and call us if readings are greater than 140/90.  3. Hyperlipidemia: treated with atorvastatin. Followed by PCP.   Current medicines are reviewed with the patient today.  The patient does not have concerns regarding medicines.  Labs/ tests ordered today include:   Orders Placed This Encounter  Procedures  . EKG 12-Lead   Disposition:   FU one year  Signed, Sherren Mocha, MD  09/16/2015 5:15 PM    [facility] [place], [place], [place]  [address] Phone: [phone]; Fax: [phone]

## 2016-07-17 ENCOUNTER — Other Ambulatory Visit: Payer: Self-pay | Admitting: Cardiovascular Disease

## 2016-09-12 ENCOUNTER — Other Ambulatory Visit: Payer: Self-pay | Admitting: Cardiovascular Disease

## 2016-10-03 NOTE — Progress Notes (Signed)
Cardiology Office Note Date:  10/04/2016   ID:  Randy [name], Randy [name] 18-Dec-1945, MRN [id number]  PCP:  Leighton Ruff, MD  Cardiologist:  Sherren Mocha, MD    Chief Complaint  Patient presents with  . Follow-up    essential hypertension     History of Present Illness: Randy [name] is a 71 y.o. male who presents for follow-up evaluation. The patient has coronary artery disease and he presented in 2013 with unstable angina. He was found to have significant disease in the left circumflex and he underwent PCI with overlapping drug-eluting stents guided by FFR. He had moderate mid LAD stenosis and medical therapy was recommended. The patient's left ventricular ejection fraction was preserved at 55%.  The patient is doing well. He is here alone today. He is physically active and has no exertional symptoms. He's been playing a lot of golf recently. He denies exertional chest pain or pressure, dyspnea, edema, or heart palpitations. He does have occasional feelings of pain in the center of the upper chest that only last for a matter of seconds. He hasn't had any recent episodes and these have occurred on an intermittent basis over a long period of time. He does not think they are heart related.  Past Medical History:  Diagnosis Date  . Anemia   . Anxiety   . Arthritis    "hands, knees, shoulders" (06/24/2014)  . CAD (coronary artery disease)    a. 11/2011 Cath: LM 20 ost, LAD 40 ost, 30-40p, 60-22m, D1 mild plaquing, LCX 70-80 p-m (FFR 0.78, treated with  3.0x20 and 2.75x16 Promus DES), RCA 30-27m, EF 55%, no rwma.;  b.  ETT-Myoview (06/2013):  Ex 7:00, no ST changes, soft tissue atten, no scar or ischemia, EF 62%;  06/2014 MV: EF 56%, no isch/infarct.  . H/O echocardiogram    a. 06/2014 Echo: EF 55-60%, Gr 1 DD.  [name] Hyperlipidemia   . Hypertension     Past Surgical History:  Procedure Laterality Date  . COLONOSCOPY WITH PROPOFOL N/A 02/20/2013   Procedure: COLONOSCOPY WITH PROPOFOL;   Surgeon: Jeryl Columbia, MD;  Location: [facility];  Service: Endoscopy;  Laterality: N/A;  . CORONARY ANGIOPLASTY WITH STENT PLACEMENT  11/2011   "circumflex"  . HOT HEMOSTASIS N/A 02/20/2013   Procedure: HOT HEMOSTASIS (ARGON PLASMA COAGULATION/BICAP);  Surgeon: Jeryl Columbia, MD;  Location: [facility];  Service: Endoscopy;  Laterality: N/A;  . LEFT HEART CATHETERIZATION WITH CORONARY ANGIOGRAM N/A 12/02/2011   Procedure: LEFT HEART CATHETERIZATION WITH CORONARY ANGIOGRAM;  Surgeon: Jolaine Artist, MD;  Location: [facility] CATH LAB;  Service: Cardiovascular;  Laterality: N/A;  . WISDOM TOOTH EXTRACTION      Current Outpatient Prescriptions  Medication Sig Dispense Refill  . ALPRAZolam (XANAX) 0.25 MG tablet Take 0.25 mg by mouth 2 (two) times daily as needed for anxiety.   0  . aspirin EC 81 MG tablet Take 81 mg by mouth daily.    [name] atorvastatin (LIPITOR) 40 MG tablet TAKE 1 TABLET BY MOUTH  DAILY 90 tablet 0  . benazepril (LOTENSIN) 40 MG tablet Take 40 mg by mouth daily.    [name] CALCIUM PO Take 1 tablet by mouth daily.    . Coenzyme Q10 (CO Q 10 PO) Take 10 mLs by mouth daily.    . Glucosamine HCl (GLUCOSAMINE PO) Take 1 tablet by mouth daily.    . metoprolol succinate (TOPROL-XL) 50 MG 24 hr tablet Take 1 tablet (50 mg total) by mouth daily. Cassandra  tablet 3  . Multiple Vitamin (MULTIVITAMIN WITH MINERALS) TABS Take 1 tablet by mouth every morning.    . Naproxen Sodium (ALEVE PO) Take 2 tablets by mouth daily as needed (pain).    . nitroGLYCERIN (NITROSTAT) 0.4 MG SL tablet Place 0.4 mg under the tongue every 5 (five) minutes as needed for chest pain.    . TURMERIC PO Take 10 mLs by mouth daily.    [name] zolpidem (AMBIEN) 10 MG tablet Take 10 mg by mouth at bedtime as needed for sleep.      No current facility-administered medications for this visit.     Allergies:   Codeine   Social History:  The patient  reports that he has never smoked. He has quit using smokeless tobacco. His smokeless  tobacco use included Chew. He reports that he drinks alcohol. He reports that he does not use drugs.   Family History:  The patient's family history includes Coronary artery disease in his brother; Diabetes type I in his brother and mother; Heart attack in his brother; Other in his mother; Pneumonia in his father.    ROS:  Please see the history of present illness.  Otherwise, review of systems is positive for Diarrhea, snoring, anxiety.  All other systems are reviewed and negative.    PHYSICAL EXAM: VS:  BP 130/80   Pulse 60   Ht 5\' 7"  (1.702 m)   Wt 191 lb 12.8 oz (87 kg)   BMI 30.04 kg/m  , BMI Body mass index is 30.04 kg/m. GEN: Well nourished, well developed, in no acute distress  HEENT: normal  Neck: no JVD, no masses. No carotid bruits Cardiac: RRR without murmur or gallop                Respiratory:  clear to auscultation bilaterally, normal work of breathing GI: soft, nontender, nondistended, + BS MS: no deformity or atrophy  Ext: no pretibial edema, pedal pulses 2+= bilaterally Skin: warm and dry, no rash Neuro:  Strength and sensation are intact Psych: euthymic mood, full affect  EKG:  EKG is ordered today. The ekg ordered today shows sinus rhythm 59 bpm, within normal limits  Recent Labs: No results found for requested labs within last 8760 hours.   Lipid Panel     Component Value Date/Time   CHOL 113 08/21/2012 0744   TRIG 63.0 08/21/2012 0744   HDL 42.60 08/21/2012 0744   CHOLHDL 3 08/21/2012 0744   VLDL 12.6 08/21/2012 0744   LDLCALC 58 08/21/2012 0744      Wt Readings from Last 3 Encounters:  10/04/16 191 lb 12.8 oz (87 kg)  09/16/15 194 lb 1.9 oz (88.1 kg)  08/30/14 186 lb 6.4 oz (84.6 kg)     ASSESSMENT AND PLAN: 1.  CAD, native vessel, without angina: The patient is doing well at present. He has good functional capacity and no symptoms with exertion. His medications are reviewed and will be continued without change.  2. HTN: Blood pressure is  well controlled. He checks his blood pressure this morning and it was 108/60 at home. He will continue on benazepril and metoprolol succinate.  3. Hyperlipidemia. Treated with atorvastatin. Lipids have been followed by his PCP. Recent blood work showed a cholesterol of 121, LDL 63, HDL 46  Current medicines are reviewed with the patient today.  The patient does not have concerns regarding medicines.  Labs/ tests ordered today include:  No orders of the defined types were placed in this encounter.  Disposition:   FU one year  Signed, Sherren Mocha, MD  10/04/2016 9:16 AM    [facility] [address], [place], [place]  [address] Phone: [phone]; Fax: [phone]

## 2016-10-04 ENCOUNTER — Encounter: Payer: Self-pay | Admitting: Cardiovascular Disease

## 2016-10-04 ENCOUNTER — Ambulatory Visit (INDEPENDENT_AMBULATORY_CARE_PROVIDER_SITE_OTHER): Payer: Medicare Other | Admitting: Cardiovascular Disease

## 2016-10-04 VITALS — BP 130/80 | HR 60 | Ht 67.0 in | Wt 191.8 lb

## 2016-10-04 DIAGNOSIS — I10 Essential (primary) hypertension: Secondary | ICD-10-CM | POA: Diagnosis not present

## 2016-10-04 DIAGNOSIS — I2510 Atherosclerotic heart disease of native coronary artery without angina pectoris: Secondary | ICD-10-CM | POA: Diagnosis not present

## 2016-10-04 NOTE — Patient Instructions (Signed)

## 2016-10-18 ENCOUNTER — Other Ambulatory Visit: Payer: Self-pay | Admitting: Cardiovascular Disease

## 2017-08-08 ENCOUNTER — Other Ambulatory Visit: Payer: Self-pay | Admitting: Cardiovascular Disease

## 2017-10-30 ENCOUNTER — Other Ambulatory Visit: Payer: Self-pay | Admitting: Cardiovascular Disease

## 2017-11-21 ENCOUNTER — Ambulatory Visit: Payer: Medicare Other | Admitting: Cardiovascular Disease

## 2017-11-21 ENCOUNTER — Encounter: Payer: Self-pay | Admitting: Cardiovascular Disease

## 2017-11-21 VITALS — BP 150/88 | HR 53 | Ht 67.0 in | Wt 198.6 lb

## 2017-11-21 DIAGNOSIS — I2510 Atherosclerotic heart disease of native coronary artery without angina pectoris: Secondary | ICD-10-CM

## 2017-11-21 NOTE — Progress Notes (Signed)
Cardiology Office Note Date:  11/23/2017   ID:  Randy [name], Randy [name] 01/13/1946, MRN [id number]  PCP:  Leighton Ruff, MD  Cardiologist:  Sherren Mocha, MD    Chief Complaint  Patient presents with  . Coronary Artery Disease     History of Present Illness: Randy [name] is a 72 y.o. male who presents for follow-up evaluation. The patient has coronary artery disease and he presented in 2013 with unstable angina. He was found to have significant disease in the left circumflex and he underwent PCI with overlapping drug-eluting stents guided by FFR. He had moderate mid LAD stenosis and medical therapy was recommended. The patient's left ventricular ejection fraction was preserved at 55%.  He is here alone today. Reports BP control at home is good. Long hx of white coat HTN. He's active, playing golf regularly. Today, he denies symptoms of palpitations, chest pain, shortness of breath, orthopnea, PND, lower extremity edema, dizziness, or syncope.   Past Medical History:  Diagnosis Date  . Anemia   . Anxiety   . Arthritis    "hands, knees, shoulders" (06/24/2014)  . CAD (coronary artery disease)    a. 11/2011 Cath: LM 20 ost, LAD 40 ost, 30-40p, 60-50m, D1 mild plaquing, LCX 70-80 p-m (FFR 0.78, treated with  3.0x20 and 2.75x16 Promus DES), RCA 30-62m, EF 55%, no rwma.;  b.  ETT-Myoview (06/2013):  Ex 7:00, no ST changes, soft tissue atten, no scar or ischemia, EF 62%;  06/2014 MV: EF 56%, no isch/infarct.  . H/O echocardiogram    a. 06/2014 Echo: EF 55-60%, Gr 1 DD.  Randy [name] Hyperlipidemia   . Hypertension     Past Surgical History:  Procedure Laterality Date  . COLONOSCOPY WITH PROPOFOL N/A 02/20/2013   Procedure: COLONOSCOPY WITH PROPOFOL;  Surgeon: Randy Columbia, MD;  Location: [facility];  Service: Endoscopy;  Laterality: N/A;  . CORONARY ANGIOPLASTY WITH STENT PLACEMENT  11/2011   "circumflex"  . HOT HEMOSTASIS N/A 02/20/2013   Procedure: HOT HEMOSTASIS (ARGON PLASMA  COAGULATION/BICAP);  Surgeon: Randy Columbia, MD;  Location: [facility];  Service: Endoscopy;  Laterality: N/A;  . LEFT HEART CATHETERIZATION WITH CORONARY ANGIOGRAM N/A 12/02/2011   Procedure: LEFT HEART CATHETERIZATION WITH CORONARY ANGIOGRAM;  Surgeon: Randy Artist, MD;  Location: [facility] CATH LAB;  Service: Cardiovascular;  Laterality: N/A;  . WISDOM TOOTH EXTRACTION      Current Outpatient Medications  Medication Sig Dispense Refill  . ALPRAZolam (XANAX) 0.25 MG tablet Take 0.25 mg by mouth 2 (two) times daily as needed for anxiety.   0  . aspirin EC 81 MG tablet Take 81 mg by mouth daily.    Randy [name] atorvastatin (LIPITOR) 40 MG tablet Take 1 tablet (40 mg total) by mouth daily at 6 PM. Please keep upcoming appt in August for future refills. Thank you 90 tablet 0  . benazepril (LOTENSIN) 40 MG tablet Take 40 mg by mouth daily.    Randy [name] CALCIUM PO Take 1 tablet by mouth daily.    . Coenzyme Q10 (CO Q 10 PO) Take 10 mLs by mouth daily.    . Glucosamine HCl (GLUCOSAMINE PO) Take 1 tablet by mouth 2 (two) times daily.     . metoprolol succinate (TOPROL-XL) 50 MG 24 hr tablet Take 1 tablet (50 mg total) by mouth daily. 90 tablet 3  . Multiple Vitamin (MULTIVITAMIN WITH MINERALS) TABS Take 1 tablet by mouth every morning.    . Naproxen Sodium (ALEVE PO) Take 2 tablets by  mouth daily as needed (pain).    . nitroGLYCERIN (NITROSTAT) 0.4 MG SL tablet Place 0.4 mg under the tongue every 5 (five) minutes as needed for chest pain.    . TURMERIC PO Take 10 mLs by mouth daily.    Randy [name] zolpidem (AMBIEN) 10 MG tablet Take 10 mg by mouth at bedtime as needed for sleep.      No current facility-administered medications for this visit.     Allergies:   Codeine   Social History:  The patient  reports that he has never smoked. He has quit using smokeless tobacco.  His smokeless tobacco use included chew. He reports that he drinks alcohol. He reports that he does not use drugs.   Family History:  The patient's   family history includes Coronary artery disease in his brother; Diabetes type I in his brother and mother; Heart attack in his brother; Other in his mother; Pneumonia in his father.    ROS:  Please see the history of present illness. Positive for shoulder and knee pain/arthritis.  All other systems are reviewed and negative.    PHYSICAL EXAM: VS:  BP (!) 150/88   Pulse (!) 53   Ht 5\' 7"  (1.702 m)   Wt 198 lb 9.6 oz (90.1 kg)   SpO2 97%   BMI 31.11 kg/m  , BMI Body mass index is 31.11 kg/m. GEN: Well nourished, well developed, in no acute distress  HEENT: normal  Neck: no JVD, no masses. No carotid bruits Cardiac: bradycardic and regular without murmur or gallop                Respiratory:  clear to auscultation bilaterally, normal work of breathing GI: soft, nontender, nondistended, + BS MS: no deformity or atrophy  Ext: no pretibial edema, pedal pulses 2+= bilaterally Skin: warm and dry, no rash Neuro:  Strength and sensation are intact Psych: euthymic mood, full affect  EKG:  EKG is ordered today. The ekg ordered today shows sinus bradycardia 53 bpm, otherwise within normal limits  Recent Labs: No results found for requested labs within last 8760 hours.   Lipid Panel     Component Value Date/Time   CHOL 113 08/21/2012 0744   TRIG 63.0 08/21/2012 0744   HDL 42.60 08/21/2012 0744   CHOLHDL 3 08/21/2012 0744   VLDL 12.6 08/21/2012 0744   LDLCALC 58 08/21/2012 0744      Wt Readings from Last 3 Encounters:  11/21/17 198 lb 9.6 oz (90.1 kg)  10/04/16 191 lb 12.8 oz (87 kg)  09/16/15 194 lb 1.9 oz (88.1 kg)    ASSESSMENT AND PLAN: 1.  CAD, native vessel, without angina: appears to be doing well, asymptomatic. Moderate CAD noted at last cath. Recommend exercise treadmill study. Medical program reviewed and will be conitnued with ASA< atorvastatin, ACE/beta blocker.   2. HTN: white-coat component. Control acceptable home readings discussed.  3. Hyperlipidemia: treated  with atorvastatin. Last LDL 63 mg/dL.   Current medicines are reviewed with the patient today.  The patient does not have concerns regarding medicines.  Labs/ tests ordered today include:   Orders Placed This Encounter  Procedures  . EXERCISE TOLERANCE TEST (ETT)  . EKG 12-Lead    Disposition:   FU one year  Signed, Sherren Mocha, MD  11/23/2017 11:47 PM    [address], [place], [place]  [address] Phone: [phone]; Fax: [phone]

## 2017-11-21 NOTE — Patient Instructions (Signed)
Medication Instructions:  Your provider recommends that you continue on your current medications as directed. Please refer to the Current Medication list given to you today.    Labwork: None  Testing/Procedures: Your provider has requested that you have an exercise tolerance test. For further information please visit [email or web address]. Please also follow instruction sheet, as given.   Follow-Up: Your provider wants you to follow-up in: 1 year with Dr. Burt Knack. You will receive a reminder letter in the mail two months in advance. If you don't receive a letter, please call our office to schedule the follow-up appointment.    Any Other Special Instructions Will Be Listed Below (If Applicable).     If you need a refill on your cardiac medications before your next appointment, please call your pharmacy.

## 2017-11-23 ENCOUNTER — Encounter: Payer: Self-pay | Admitting: Cardiovascular Disease

## 2017-11-23 ENCOUNTER — Ambulatory Visit: Payer: Medicare Other

## 2017-11-23 DIAGNOSIS — I2510 Atherosclerotic heart disease of native coronary artery without angina pectoris: Secondary | ICD-10-CM

## 2017-11-23 LAB — EXERCISE TOLERANCE TEST
CSEPED: 8 min
CSEPEDS: 0 s
Estimated workload: 10.1 METS
MPHR: 150 {beats}/min
Peak HR: 144 {beats}/min
Percent HR: 96 %
RPE: 15
Rest HR: 62 {beats}/min

## 2017-12-07 ENCOUNTER — Other Ambulatory Visit: Payer: Self-pay

## 2017-12-07 ENCOUNTER — Emergency Department (HOSPITAL_COMMUNITY)
Admission: EM | Admit: 2017-12-07 | Discharge: 2017-12-08 | Disposition: A | Discharge status: Home / Self Care | Payer: Medicare Other | Attending: Emergency Medicine | Admitting: Emergency Medicine

## 2017-12-07 DIAGNOSIS — Z7982 Long term (current) use of aspirin: Secondary | ICD-10-CM | POA: Insufficient documentation

## 2017-12-07 DIAGNOSIS — I10 Essential (primary) hypertension: Secondary | ICD-10-CM

## 2017-12-07 DIAGNOSIS — Z79899 Other long term (current) drug therapy: Secondary | ICD-10-CM | POA: Insufficient documentation

## 2017-12-07 DIAGNOSIS — Z955 Presence of coronary angioplasty implant and graft: Secondary | ICD-10-CM | POA: Insufficient documentation

## 2017-12-07 DIAGNOSIS — I2510 Atherosclerotic heart disease of native coronary artery without angina pectoris: Secondary | ICD-10-CM | POA: Diagnosis not present

## 2017-12-07 NOTE — ED Triage Notes (Addendum)
Pt reports a funny feeling in his head and some queasiness and dizziness that started an hour ago. Pt has hx of HTN and is compliant. Pt reports home BP was 178/91 tonight and has been higher than normal the last few days. Pt reports taking a nitro on the way to the hospital. Pt denies pain. Pt has been tracking Bps after a stress test for PCP. Hx of cardiac caths and stents.

## 2017-12-08 ENCOUNTER — Other Ambulatory Visit: Payer: Self-pay

## 2017-12-08 ENCOUNTER — Encounter (HOSPITAL_COMMUNITY): Payer: Self-pay | Admitting: Emergency Medicine

## 2017-12-08 LAB — CBC
HCT: 38.5 % — ABNORMAL LOW (ref 39.0–52.0)
Hemoglobin: 13 g/dL (ref 13.0–17.0)
MCH: 32.5 pg (ref 26.0–34.0)
MCHC: 33.8 g/dL (ref 30.0–36.0)
MCV: 96.3 fL (ref 78.0–100.0)
PLATELETS: 167 10*3/uL (ref 150–400)
RBC: 4 MIL/uL — AB (ref 4.22–5.81)
RDW: 12.5 % (ref 11.5–15.5)
WBC: 6.1 10*3/uL (ref 4.0–10.5)

## 2017-12-08 LAB — BASIC METABOLIC PANEL
Anion gap: 9 (ref 5–15)
BUN: 15 mg/dL (ref 8–23)
CHLORIDE: 105 mmol/L (ref 98–111)
CO2: 24 mmol/L (ref 22–32)
CREATININE: 0.99 mg/dL (ref 0.61–1.24)
Calcium: 9 mg/dL (ref 8.9–10.3)
GFR calc non Af Amer: >60 mL/min (ref >60)
Glucose, Bld: 105 mg/dL — ABNORMAL HIGH (ref 70–99)
POTASSIUM: 3.8 mmol/L (ref 3.5–5.1)
SODIUM: 138 mmol/L (ref 135–145)

## 2017-12-08 LAB — I-STAT TROPONIN, ED: TROPONIN I, POC: 0.03 ng/mL (ref 0.00–0.08)

## 2017-12-09 NOTE — ED Provider Notes (Signed)
[facility] EMERGENCY DEPARTMENT Provider Note   CSN: [id number] Arrival date & time: 12/07/17  2355     History   Chief Complaint Chief Complaint  Patient presents with  . Hypertension    HPI Randy [name] is a 72 y.o. male.  HPI  72 year old male comes in with chief complaint of elevated blood pressure. Patient has history of coronary artery disease and at his recent visit with the cardiologist he was advised to watch his blood pressure carefully because it was slightly elevated.  Patient reports that he has been periodically checking his blood pressure.  This evening his blood pressure was significantly elevated -and it was rising so he decided to come to the ER.  At high use the blood pressure was in the 190s to 195 range for systolic.  Patient has no chest pain, shortness of breath, headaches, numbness, tingling, weakness.  Past Medical History:  Diagnosis Date  . Anemia   . Anxiety   . Arthritis    "hands, knees, shoulders" (06/24/2014)  . CAD (coronary artery disease)    a. 11/2011 Cath: LM 20 ost, LAD 40 ost, 30-40p, 60-23m, D1 mild plaquing, LCX 70-80 p-m (FFR 0.78, treated with  3.0x20 and 2.75x16 Promus DES), RCA 30-41m, EF 55%, no rwma.;  b.  ETT-Myoview (06/2013):  Ex 7:00, no ST changes, soft tissue atten, no scar or ischemia, EF 62%;  06/2014 MV: EF 56%, no isch/infarct.  . H/O echocardiogram    a. 06/2014 Echo: EF 55-60%, Gr 1 DD.  [name] Hyperlipidemia   . Hypertension     Patient Active Problem List   Diagnosis Date Noted  . Ischemic heart disease 06/24/2014  . CAD (coronary artery disease) of artery bypass graft 12/03/2011  . Hyperlipidemia 12/03/2011  . Midsternal chest pain 12/02/2011  . HTN (hypertension) 12/02/2011    Past Surgical History:  Procedure Laterality Date  . COLONOSCOPY WITH PROPOFOL N/A 02/20/2013   Procedure: COLONOSCOPY WITH PROPOFOL;  Surgeon: Jeryl Columbia, MD;  Location: [facility]OS[facility];  Service: Endoscopy;   Laterality: N/A;  . CORONARY ANGIOPLASTY WITH STENT PLACEMENT  11/2011   "circumflex"  . HOT HEMOSTASIS N/A 02/20/2013   Procedure: HOT HEMOSTASIS (ARGON PLASMA COAGULATION/BICAP);  Surgeon: Jeryl Columbia, MD;  Location: [facility];  Service: Endoscopy;  Laterality: N/A;  . LEFT HEART CATHETERIZATION WITH CORONARY ANGIOGRAM N/A 12/02/2011   Procedure: LEFT HEART CATHETERIZATION WITH CORONARY ANGIOGRAM;  Surgeon: Jolaine Artist, MD;  Location: [facility] CATH LAB;  Service: Cardiovascular;  Laterality: N/A;  . WISDOM TOOTH EXTRACTION          Home Medications    Prior to Admission medications   Medication Sig Start Date End Date Taking? Authorizing Provider  ALPRAZolam (XANAX) 0.25 MG tablet Take 0.25 mg by mouth 2 (two) times daily as needed for anxiety.  07/01/14   [provider]  aspirin EC 81 MG tablet Take 81 mg by mouth daily. 12/03/11   Theora Gianotti, NP  atorvastatin (LIPITOR) 40 MG tablet Take 1 tablet (40 mg total) by mouth daily at 6 PM. Please keep upcoming appt in August for future refills. Thank you 10/31/17   Sherren Mocha, MD  benazepril (LOTENSIN) 40 MG tablet Take 40 mg by mouth daily.    [provider]  CALCIUM PO Take 1 tablet by mouth daily.    [provider]  Coenzyme Q10 (CO Q 10 PO) Take 10 mLs by mouth daily.    [provider]  Glucosamine HCl (GLUCOSAMINE PO) Take 1 tablet by mouth 2 (two) times daily.     [provider]  metoprolol succinate (TOPROL-XL) 50 MG 24 hr tablet Take 1 tablet (50 mg total) by mouth daily. 02/05/15   Sherren Mocha, MD  Multiple Vitamin (MULTIVITAMIN WITH MINERALS) TABS Take 1 tablet by mouth every morning.    [provider]  Naproxen Sodium (ALEVE PO) Take 2 tablets by mouth daily as needed (pain).    [provider]  nitroGLYCERIN (NITROSTAT) 0.4 MG SL tablet Place 0.4 mg under the tongue every 5 (five) minutes as needed for chest pain.    [provider]  TURMERIC PO Take 10 mLs by mouth daily.    [provider]  zolpidem (AMBIEN) 10 MG tablet Take 10 mg by mouth at bedtime as needed for sleep.  12/16/11   [provider]    Family History Family History  Problem Relation Age of Onset  . Other Mother        died @ 66 of "heart issues"  . Diabetes type I Mother   . Pneumonia Father        died @ 14  . Heart attack Brother        died @ 58  . Diabetes type I Brother        died @ 42  . Coronary artery disease Brother        alive @ 37    Social History Social History   Tobacco Use  . Smoking status: Never Smoker  . Smokeless tobacco: Former Systems developer    Types: Chew  . Tobacco comment: "quit chewing in the 1990's"  Substance Use Topics  . Alcohol use: Yes    Comment: 06/24/2014 "drink a beer [place] the golf[place] once in awhile"  . Drug use: No     Allergies   Codeine   Review of Systems Review of Systems  Constitutional: Positive for activity change.  Respiratory: Positive for shortness of breath.   Cardiovascular: Positive for chest pain.  Gastrointestinal: Negative for nausea and vomiting.  Neurological: Negative for dizziness.  Hematological: Does not bruise/bleed easily.     Physical Exam Updated Vital Signs BP (!) 146/83   Pulse (!) 56   Temp 98.5 F (36.9 C) (Oral)   Resp 13   Ht 5\' 7"  (1.702 m)   Wt 88.5 kg   SpO2 98%   BMI 30.54 kg/m   Physical Exam  Constitutional: He is oriented to person, place, and time. He appears well-developed.  HENT:  Head: Atraumatic.  Neck: Neck supple.  Cardiovascular: Normal rate.  Pulmonary/Chest: Effort normal.  Musculoskeletal: He exhibits no edema.  Neurological: He is alert and oriented to person, place, and time.  Skin: Skin is warm.  Nursing note and vitals reviewed.    ED Treatments / Results  Labs (all labs ordered are listed, but only abnormal results are displayed) Labs Reviewed  BASIC METABOLIC PANEL - Abnormal; Notable for the  following components:      Result Value   Glucose, Bld 105 (*)    All other components within normal limits  CBC - Abnormal; Notable for the following components:   RBC 4.00 (*)    HCT 38.5 (*)    All other components within normal limits  I-STAT TROPONIN, ED    EKG None  Radiology No results found.  Procedures Procedures (including critical care time)  Medications Ordered in ED Medications - No data to display  Initial Impression / Assessment and Plan / ED Course  I have reviewed the triage vital signs and the nursing notes.  Pertinent labs & imaging results that were available during my care of the patient were reviewed by me and considered in my medical decision making (see chart for details).     Patient comes in with chief complaint of elevated blood pressure.  He had an isolated blood pressure that was elevated at home, which likely went up because of his anxiety.  Or here his blood pressures are mildly elevated.  He has been asymptomatic the whole time and the labs were ordered in triage are normal.  Ready for discharge.  Final Clinical Impressions(s) / ED Diagnoses   Final diagnoses:  Hypertension, unspecified type    ED Discharge Orders    None       Varney Biles, MD 12/09/17 [phone]

## 2018-08-15 ENCOUNTER — Other Ambulatory Visit: Payer: Self-pay | Admitting: Cardiovascular Disease

## 2018-11-20 NOTE — Progress Notes (Signed)
Cardiology Office Note:    Date:  11/21/2018   ID:  Randy [name], DOB 1946/03/30, MRN [id number]  PCP:  Leighton Ruff, MD  Cardiologist:  Sherren Mocha, MD  Electrophysiologist:  None   Referring MD: Leighton Ruff, MD   Chief Complaint  Patient presents with  . Follow-up    CAD    History of Present Illness:    Randy [name] is a 73 y.o. male with:  Coronary artery disease   S/p DES x 2 to LCx in 2013  Hypertension w/ whit coat component  Hyperlipidemia   Randy [name] was last seen by Dr. Burt Knack in 11/2017.  He returns for follow-up.  He is here alone.  Since last seen, he has done well.  He does walk every morning without difficulty.  He has not had chest discomfort or significant shortness of breath.  He has not had syncope or near syncope.  He has not had orthopnea or significant lower extremity swelling.  He is mainly troubled by knee pain related to arthritis.    Prior CV studies:   The following studies were reviewed today:  ETT 11/23/17 Positive ETT 1 mm horizontal ST segment depression in inferior lateral leads with stress HTN response to exercise Per Dr. Burt Knack: Borderline changes w/ exercise, no angina >> continue med Rx  Echocardiogram 06/25/14 EF 55-60, no RWMA, Gr 1 DD  Myoview 06/12/13 Probable normal perfusion and very mild soft tissue attenuation (diaphragm)  No significant ischemia or scar.  LV Ejection Fraction: 62%.    Cardiac catheterization 11/2011 LM 20 ost, LAD 40 ost, 30-40p, 60-52m, D1 mild plaquing, LCX 70-80 p-m (FFR 0.78, treated with  3.0x20 and 2.75x16 Promus DES), RCA 30-103m, EF 55%, no rwma  Past Medical History:  Diagnosis Date  . Anemia   . Anxiety   . Arthritis    "hands, knees, shoulders" (06/24/2014)  . CAD (coronary artery disease)    a. 11/2011 Cath: LM 20 ost, LAD 40 ost, 30-40p, 60-67m, D1 mild plaquing, LCX 70-80 p-m (FFR 0.78, treated with  3.0x20 and 2.75x16 Promus DES), RCA 30-22m, EF 55%, no rwma.;  b.   ETT-Myoview (06/2013):  Ex 7:00, no ST changes, soft tissue atten, no scar or ischemia, EF 62%;  06/2014 MV: EF 56%, no isch/infarct.  . H/O echocardiogram    a. 06/2014 Echo: EF 55-60%, Gr 1 DD.  [name] Hyperlipidemia   . Hypertension    Surgical Hx: The patient  has a past surgical history that includes Colonoscopy with propofol (N/A, 02/20/2013); Hot hemostasis (N/A, 02/20/2013); left heart catheterization with coronary angiogram (N/A, 12/02/2011); Coronary angioplasty with stent (11/2011); and Wisdom tooth extraction.   Current Medications: Current Meds  Medication Sig  . aspirin EC 81 MG tablet Take 81 mg by mouth daily.  [name] atorvastatin (LIPITOR) 40 MG tablet Take 1 tablet (40 mg total) by mouth daily at 6 PM.  . benazepril (LOTENSIN) 40 MG tablet Take 40 mg by mouth daily.  [name] CALCIUM PO Take 1 tablet by mouth daily.  . Coenzyme Q10 (CO Q 10 PO) Take 10 mLs by mouth daily.  . Glucosamine HCl (GLUCOSAMINE PO) Take 1 tablet by mouth 2 (two) times daily.   . metoprolol succinate (TOPROL-XL) 50 MG 24 hr tablet Take 1 tablet (50 mg total) by mouth daily.  . Multiple Vitamin (MULTIVITAMIN WITH MINERALS) TABS Take 1 tablet by mouth every morning.  . Naproxen Sodium (ALEVE PO) Take 2 tablets by mouth daily as needed (pain).  [name]  nitroGLYCERIN (NITROSTAT) 0.4 MG SL tablet Place 0.4 mg under the tongue every 5 (five) minutes as needed for chest pain.  [name] zolpidem (AMBIEN) 10 MG tablet Take 10 mg by mouth at bedtime as needed for sleep.      Allergies:   Codeine   Social History   Tobacco Use  . Smoking status: Never Smoker  . Smokeless tobacco: Former Systems developer    Types: Chew  . Tobacco comment: "quit chewing in the 1990's"  Substance Use Topics  . Alcohol use: Yes    Comment: 06/24/2014 "drink a beer [place] the [place] once in awhile"  . Drug use: No     Family Hx: The patient's family history includes Coronary artery disease in his brother; Diabetes type I in his brother and mother; Heart attack in  his brother; Other in his mother; Pneumonia in his father.  ROS:   Please see the history of present illness.    ROS All other systems reviewed and are negative.   EKGs/Labs/Other Test Reviewed:    EKG:  EKG is  ordered today.  The ekg ordered today demonstrates sinus bradycardia, heart rate 69, normal axis, QTC 399, no change from prior tracing  Recent Labs: 12/08/2017: BUN 15; Creatinine, Ser 0.99; Hemoglobin 13.0; Platelets 167; Potassium 3.8; Sodium 138   Recent Lipid Panel Lab Results  Component Value Date/Time   CHOL 113 08/21/2012 07:44 AM   TRIG 63.0 08/21/2012 07:44 AM   HDL 42.60 08/21/2012 07:44 AM   CHOLHDL 3 08/21/2012 07:44 AM   LDLCALC 58 08/21/2012 07:44 AM     Physical Exam:    VS:  BP 130/70   Pulse 64   Ht 5\' 7"  (1.702 m)   Wt 197 lb 12.8 oz (89.7 kg)   SpO2 96%   BMI 30.98 kg/m     Wt Readings from Last 3 Encounters:  11/21/18 197 lb 12.8 oz (89.7 kg)  12/08/17 195 lb (88.5 kg)  11/21/17 198 lb 9.6 oz (90.1 kg)     Physical Exam  Constitutional: He is oriented to person, place, and time. He appears well-developed and well-nourished. No distress.  HENT:  Head: Normocephalic and atraumatic.  Neck: Neck supple. No JVD present.  Cardiovascular: Normal rate, regular rhythm, S1 normal and S2 normal.  No murmur heard. Pulmonary/Chest: Breath sounds normal. He has no rales.  Abdominal: Soft. There is no hepatomegaly.  Musculoskeletal:        General: No edema.  Neurological: He is alert and oriented to person, place, and time.  Skin: Skin is warm and dry.    ASSESSMENT & PLAN:    1. Coronary artery disease involving native coronary artery of native heart without angina pectoris History of DES x2 to the LCx in 2013.  Exercise treadmill test last year was overall stable.  Continue aspirin, statin.  2. Essential hypertension The patient's blood pressure is controlled on his current regimen.  Continue current therapy.   3. Hyperlipidemia,  unspecified hyperlipidemia type LDL optimal on most recent lab work.  Continue current Rx.     Dispo:  Return in about 1 year (around 11/21/2019) for Routine Follow Up, w/ Dr. Burt Knack, or Richardson Dopp, PA-C.   Medication Adjustments/Labs and Tests Ordered: Current medicines are reviewed at length with the patient today.  Concerns regarding medicines are outlined above.  Tests Ordered: Orders Placed This Encounter  Procedures  . EKG 12-Lead   Medication Changes: No orders of the defined types were placed in this encounter.  Signed, Richardson Dopp, PA-C  11/21/2018 8:41 AM    [facility] [place], [place], [place]  [address] Phone: [phone]; Fax: [phone]

## 2018-11-21 ENCOUNTER — Ambulatory Visit (INDEPENDENT_AMBULATORY_CARE_PROVIDER_SITE_OTHER): Payer: Medicare Other | Admitting: Physician Assistant

## 2018-11-21 ENCOUNTER — Encounter: Payer: Self-pay | Admitting: Physician Assistant

## 2018-11-21 ENCOUNTER — Other Ambulatory Visit: Payer: Self-pay

## 2018-11-21 VITALS — BP 130/70 | HR 64 | Ht 67.0 in | Wt 197.8 lb

## 2018-11-21 DIAGNOSIS — I10 Essential (primary) hypertension: Secondary | ICD-10-CM | POA: Diagnosis not present

## 2018-11-21 DIAGNOSIS — E785 Hyperlipidemia, unspecified: Secondary | ICD-10-CM

## 2018-11-21 DIAGNOSIS — I2510 Atherosclerotic heart disease of native coronary artery without angina pectoris: Secondary | ICD-10-CM | POA: Diagnosis not present

## 2018-11-21 NOTE — Patient Instructions (Signed)
Medication Instructions:  Your physician recommends that you continue on your current medications as directed. Please refer to the Current Medication list given to you today.  If you need a refill on your cardiac medications before your next appointment, please call your pharmacy.   Lab work: None   If you have labs (blood work) drawn today and your tests are completely normal, you will receive your results only by: [name] MyChart Message (if you have MyChart) OR . A paper copy in the mail If you have any lab test that is abnormal or we need to change your treatment, we will call you to review the results.  Testing/Procedures: None    Follow-Up: At [facility], you and your health needs are our priority.  As part of our continuing mission to provide you with exceptional heart care, we have created designated Provider Care Teams.  These Care Teams include your primary Cardiologist (physician) and Advanced Practice Providers (APPs -  Physician Assistants and Nurse Practitioners) who all work together to provide you with the care you need, when you need it. You will need a follow up appointment in:  12 months.  Please call our office 2 months in advance to schedule this appointment.  You may see Sherren Mocha, MD or one of the following Advanced Practice Providers on your designated Care Team: Richardson Dopp, PA-C   Any Other Special Instructions Will Be Listed Below (If Applicable).

## 2019-05-03 ENCOUNTER — Ambulatory Visit: Payer: Medicare Other

## 2019-05-11 ENCOUNTER — Ambulatory Visit: Payer: Medicare PPO | Attending: Internal Medicine

## 2019-05-11 DIAGNOSIS — Z23 Encounter for immunization: Secondary | ICD-10-CM | POA: Insufficient documentation

## 2019-05-11 NOTE — Progress Notes (Signed)
   Covid-19 Vaccination Clinic  Name:  Randy [name]    MRN: [id number] DOB: 06-Dec-1945  05/11/2019  Randy [name] was observed post Covid-19 immunization for 15 minutes without incidence. He was provided with Vaccine Information Sheet and instruction to access the V-Safe system.   Randy [name] was instructed to call 911 with any severe reactions post vaccine: [name] Difficulty breathing  . Swelling of your face and throat  . A fast heartbeat  . A bad rash all over your body  . Dizziness and weakness    Immunizations Administered    Name Date Dose VIS Date Route   Pfizer COVID-19 Vaccine 05/11/2019  4:25 PM 0.3 mL 03/16/2019 Intramuscular   Manufacturer: [place]   Lot: [id number]   [place]: [id number]

## 2019-05-24 ENCOUNTER — Ambulatory Visit: Payer: Medicare Other

## 2019-06-05 ENCOUNTER — Ambulatory Visit: Payer: Medicare PPO | Attending: Internal Medicine

## 2019-06-05 DIAGNOSIS — Z23 Encounter for immunization: Secondary | ICD-10-CM | POA: Insufficient documentation

## 2019-06-05 NOTE — Progress Notes (Signed)
   Covid-19 Vaccination Clinic  Name:  Randy [name]    MRN: [id number] DOB: 1945/06/11  06/05/2019  Mr. Randy [name] was observed post Covid-19 immunization for 15 minutes without incident. He was provided with Vaccine Information Sheet and instruction to access the V-Safe system.   Mr. Randy [name] was instructed to call 911 with any severe reactions post vaccine: [name] Difficulty breathing  . Swelling of face and throat  . A fast heartbeat  . A bad rash all over body  . Dizziness and weakness   Immunizations Administered    Name Date Dose VIS Date Route   Pfizer COVID-19 Vaccine 06/05/2019  3:24 PM 0.3 mL 03/16/2019 Intramuscular   Manufacturer: [place]   Lot: [id number]   [place]: [id number]

## 2019-10-16 DIAGNOSIS — H04123 Dry eye syndrome of bilateral lacrimal glands: Secondary | ICD-10-CM | POA: Diagnosis not present

## 2019-10-16 DIAGNOSIS — H0102A Squamous blepharitis right eye, upper and lower eyelids: Secondary | ICD-10-CM | POA: Diagnosis not present

## 2019-10-16 DIAGNOSIS — H5213 Myopia, bilateral: Secondary | ICD-10-CM | POA: Diagnosis not present

## 2019-10-16 DIAGNOSIS — H43812 Vitreous degeneration, left eye: Secondary | ICD-10-CM | POA: Diagnosis not present

## 2019-10-16 DIAGNOSIS — D3131 Benign neoplasm of right choroid: Secondary | ICD-10-CM | POA: Diagnosis not present

## 2019-10-16 DIAGNOSIS — H524 Presbyopia: Secondary | ICD-10-CM | POA: Diagnosis not present

## 2019-10-16 DIAGNOSIS — Z961 Presence of intraocular lens: Secondary | ICD-10-CM | POA: Diagnosis not present

## 2019-10-16 DIAGNOSIS — H0102B Squamous blepharitis left eye, upper and lower eyelids: Secondary | ICD-10-CM | POA: Diagnosis not present

## 2019-10-22 DIAGNOSIS — G4700 Insomnia, unspecified: Secondary | ICD-10-CM | POA: Diagnosis not present

## 2019-10-22 DIAGNOSIS — Z955 Presence of coronary angioplasty implant and graft: Secondary | ICD-10-CM | POA: Diagnosis not present

## 2019-10-22 DIAGNOSIS — I10 Essential (primary) hypertension: Secondary | ICD-10-CM | POA: Diagnosis not present

## 2019-10-22 DIAGNOSIS — E7800 Pure hypercholesterolemia, unspecified: Secondary | ICD-10-CM | POA: Diagnosis not present

## 2019-10-22 DIAGNOSIS — I2510 Atherosclerotic heart disease of native coronary artery without angina pectoris: Secondary | ICD-10-CM | POA: Diagnosis not present

## 2019-10-22 DIAGNOSIS — Z125 Encounter for screening for malignant neoplasm of prostate: Secondary | ICD-10-CM | POA: Diagnosis not present

## 2019-10-22 DIAGNOSIS — R7303 Prediabetes: Secondary | ICD-10-CM | POA: Diagnosis not present

## 2019-10-25 DIAGNOSIS — F419 Anxiety disorder, unspecified: Secondary | ICD-10-CM | POA: Diagnosis not present

## 2019-10-25 DIAGNOSIS — Z955 Presence of coronary angioplasty implant and graft: Secondary | ICD-10-CM | POA: Diagnosis not present

## 2019-10-25 DIAGNOSIS — I10 Essential (primary) hypertension: Secondary | ICD-10-CM | POA: Diagnosis not present

## 2019-10-25 DIAGNOSIS — R7303 Prediabetes: Secondary | ICD-10-CM | POA: Diagnosis not present

## 2019-10-25 DIAGNOSIS — I2510 Atherosclerotic heart disease of native coronary artery without angina pectoris: Secondary | ICD-10-CM | POA: Diagnosis not present

## 2019-10-25 DIAGNOSIS — G4700 Insomnia, unspecified: Secondary | ICD-10-CM | POA: Diagnosis not present

## 2019-10-25 DIAGNOSIS — E7800 Pure hypercholesterolemia, unspecified: Secondary | ICD-10-CM | POA: Diagnosis not present

## 2019-11-26 NOTE — Progress Notes (Signed)
Cardiology Office Note:    Date:  11/27/2019   ID:  Randy, [name] [birth year], MRN [id number]  PCP:  Leighton Ruff, MD  Cardiologist:  Sherren Mocha, MD  Electrophysiologist:  None   Referring MD: Leighton Ruff, MD   Chief Complaint:  Follow-up (CAD)    Patient Profile:    Randy [name] is a 74 y.o. male with:   Coronary artery disease  ? S/p DES x 2 to LCx in 2013  Hypertension w/ whit coat component  Hyperlipidemia   Prior CV studies: ETT 11/23/17 Positive ETT 1 mm horizontal ST segment depression in inferior lateral leads with stress HTN response to exercise Per Dr. Burt Knack: Borderline changes w/ exercise, no angina >> continue med Rx  Echocardiogram 06/25/14 EF 55-60, no RWMA, Gr 1 DD  Myoview 06/12/13 Probable normal perfusion and very mild soft tissue attenuation (diaphragm) No significant ischemia or scar. LV Ejection Fraction: 62%.   Cardiac catheterization 11/2011 LM 20 ost, LAD 40 ost, 30-40p, 60-19m, D1 mild plaquing, LCX 70-80 p-m (FFR 0.78, treated with 3.0x20 and 2.75x16 Promus DES), RCA 30-8m, EF 55%, no rwma   History of Present Illness:    Randy [name] returns for follow-up on coronary artery disease. He has been doing well without chest discomfort, significant shortness of breath, syncope, orthopnea, lower extremity swelling. His blood pressures at home have been optimal.  Past Medical History:  Diagnosis Date  . Anemia   . Anxiety   . Arthritis    "hands, knees, shoulders" (06/24/2014)  . CAD (coronary artery disease)    a. 11/2011 Cath: LM 20 ost, LAD 40 ost, 30-40p, 60-51m, D1 mild plaquing, LCX 70-80 p-m (FFR 0.78, treated with  3.0x20 and 2.75x16 Promus DES), RCA 30-80m, EF 55%, no rwma.;  b.  ETT-Myoview (06/2013):  Ex 7:00, no ST changes, soft tissue atten, no scar or ischemia, EF 62%;  06/2014 MV: EF 56%, no isch/infarct.  . H/O echocardiogram    a. 06/2014 Echo: EF 55-60%, Gr 1 DD.  [name] Hyperlipidemia   . Hypertension      Current Medications: Current Meds  Medication Sig  . amoxicillin (AMOXIL) 500 MG capsule   . aspirin EC 81 MG tablet Take 81 mg by mouth daily.  [name] atorvastatin (LIPITOR) 40 MG tablet Take 1 tablet (40 mg total) by mouth daily at 6 PM.  . benazepril (LOTENSIN) 40 MG tablet Take 40 mg by mouth daily.  [name] CALCIUM PO Take 1 tablet by mouth daily.  . chlorhexidine (PERIDEX) 0.12 % solution Use as directed 5 mLs in the mouth or throat 2 (two) times daily.   . Coenzyme Q10 (CO Q 10 PO) Take 10 mLs by mouth daily.  . Glucosamine HCl (GLUCOSAMINE PO) Take 1 tablet by mouth 2 (two) times daily.   . metoprolol succinate (TOPROL-XL) 50 MG 24 hr tablet Take 1 tablet (50 mg total) by mouth daily.  . Multiple Vitamin (MULTIVITAMIN WITH MINERALS) TABS Take 1 tablet by mouth every morning.  . Naproxen Sodium (ALEVE PO) Take 2 tablets by mouth daily as needed (pain).  . nitroGLYCERIN (NITROSTAT) 0.4 MG SL tablet Place 0.4 mg under the tongue every 5 (five) minutes as needed for chest pain.  [name] zolpidem (AMBIEN) 10 MG tablet Take 10 mg by mouth at bedtime as needed for sleep.      Allergies:   Codeine   Social History   Tobacco Use  . Smoking status: Never Smoker  . Smokeless tobacco: Former Systems developer  Types: Chew  . Tobacco comment: "quit chewing in the 1990's"  Vaping Use  . Vaping Use: Never used  Substance Use Topics  . Alcohol use: Yes    Comment: 06/24/2014 "drink a beer on the golfcourse once in awhile"  . Drug use: No     Family Hx: The patient's family history includes Coronary artery disease in his brother; Diabetes type I in his brother and mother; Heart attack in his brother; Other in his mother; Pneumonia in his father.  ROS   EKGs/Labs/Other Test Reviewed:    EKG:  EKG is   ordered today.  The ekg ordered today demonstrates sinus rhythm, heart rate 69, normal axis, septal Q waves, QTC 430, no change from prior tracing  Recent Labs: Labs from PCP ([facility]) personally reviewed  and interpreted: 10/22/2019-total cholesterol 116, HDL 46, LDL 58, triglycerides 54, creatinine 0.98, K 4.6, ALT 16  Recent Lipid Panel Lab Results  Component Value Date/Time   CHOL 113 08/21/2012 07:44 AM   TRIG 63.0 08/21/2012 07:44 AM   HDL 42.60 08/21/2012 07:44 AM   CHOLHDL 3 08/21/2012 07:44 AM   LDLCALC 58 08/21/2012 07:44 AM    Physical Exam:    VS:  BP 122/78   Pulse 69   Ht 5\' 7"  (1.702 m)   Wt 192 lb (87.1 kg)   SpO2 97%   BMI 30.07 kg/m     Wt Readings from Last 3 Encounters:  11/27/19 192 lb (87.1 kg)  11/21/18 197 lb 12.8 oz (89.7 kg)  12/08/17 195 lb (88.5 kg)     Constitutional:      Appearance: Healthy appearance. Not in distress.  Neck:     Thyroid: No thyromegaly.     Vascular: No carotid bruit. JVD normal.  Pulmonary:     Effort: Pulmonary effort is normal.     Breath sounds: No wheezing. No rales.  Cardiovascular:     Normal rate. Regular rhythm. Normal S1. Normal S2.     Murmurs: There is no murmur.  Edema:    Peripheral edema absent.  Abdominal:     Palpations: Abdomen is soft. There is no hepatomegaly.  Skin:    General: Skin is warm and dry.  Neurological:     General: No focal deficit present.     Mental Status: Alert and oriented to person, place and time.     Cranial Nerves: Cranial nerves are intact.      ASSESSMENT & PLAN:    1. Coronary artery disease involving native coronary artery of native heart without angina pectoris History of DES x2 to the LCx in 2013. He is doing well without anginal symptoms. Continue aspirin, atorvastatin, metoprolol succinate. Follow-up in 1 year.  2. Essential hypertension The patient's blood pressure is controlled on his current regimen.  Continue current dose of benazepril, metoprolol succinate.   3. Hyperlipidemia, unspecified hyperlipidemia type LDL optimal on most recent lab work.  Continue current dose of atorvastatin.       Dispo:  Return in about 1 year (around 11/26/2020) for Routine  Follow Up, w/ Dr. Burt Knack, in person.   Medication Adjustments/Labs and Tests Ordered: Current medicines are reviewed at length with the patient today.  Concerns regarding medicines are outlined above.  Tests Ordered: Orders Placed This Encounter  Procedures  . EKG 12-Lead   Medication Changes: No orders of the defined types were placed in this encounter.   Signed, Richardson Dopp, PA-C  11/27/2019 11:08 AM    [facility] [place], [place], [place]  [address] Phone: [phone]; Fax: [phone]

## 2019-11-27 ENCOUNTER — Other Ambulatory Visit: Payer: Self-pay

## 2019-11-27 ENCOUNTER — Encounter: Payer: Self-pay | Admitting: Physician Assistant

## 2019-11-27 ENCOUNTER — Ambulatory Visit: Payer: Medicare PPO | Admitting: Physician Assistant

## 2019-11-27 VITALS — BP 122/78 | HR 69 | Ht 67.0 in | Wt 192.0 lb

## 2019-11-27 DIAGNOSIS — I10 Essential (primary) hypertension: Secondary | ICD-10-CM

## 2019-11-27 DIAGNOSIS — I2510 Atherosclerotic heart disease of native coronary artery without angina pectoris: Secondary | ICD-10-CM

## 2019-11-27 DIAGNOSIS — E785 Hyperlipidemia, unspecified: Secondary | ICD-10-CM | POA: Diagnosis not present

## 2019-11-27 NOTE — Patient Instructions (Signed)
Medication Instructions:  Your physician recommends that you continue on your current medications as directed. Please refer to the Current Medication list given to you today.  *If you need a refill on your cardiac medications before your next appointment, please call your pharmacy*  Lab Work: None ordered today  Testing/Procedures: None ordered today  Follow-Up: At [facility], you and your health needs are our priority.  As part of our continuing mission to provide you with exceptional heart care, we have created designated Provider Care Teams.  These Care Teams include your primary Cardiologist (physician) and Advanced Practice Providers (APPs -  Physician Assistants and Nurse Practitioners) who all work together to provide you with the care you need, when you need it.  Your next appointment:   12 month(s)  The format for your next appointment:   In Person  Provider:   Michael Cooper, MD   

## 2019-12-25 DIAGNOSIS — M1711 Unilateral primary osteoarthritis, right knee: Secondary | ICD-10-CM | POA: Diagnosis not present

## 2019-12-25 DIAGNOSIS — M170 Bilateral primary osteoarthritis of knee: Secondary | ICD-10-CM | POA: Diagnosis not present

## 2019-12-25 DIAGNOSIS — M25561 Pain in right knee: Secondary | ICD-10-CM | POA: Diagnosis not present

## 2020-02-12 DIAGNOSIS — L821 Other seborrheic keratosis: Secondary | ICD-10-CM | POA: Diagnosis not present

## 2020-02-12 DIAGNOSIS — L508 Other urticaria: Secondary | ICD-10-CM | POA: Diagnosis not present

## 2020-02-12 DIAGNOSIS — Z1283 Encounter for screening for malignant neoplasm of skin: Secondary | ICD-10-CM | POA: Diagnosis not present

## 2020-02-19 DIAGNOSIS — L508 Other urticaria: Secondary | ICD-10-CM | POA: Diagnosis not present

## 2020-03-04 DIAGNOSIS — L508 Other urticaria: Secondary | ICD-10-CM | POA: Diagnosis not present

## 2020-03-04 DIAGNOSIS — L570 Actinic keratosis: Secondary | ICD-10-CM | POA: Diagnosis not present

## 2020-03-04 DIAGNOSIS — X32XXXD Exposure to sunlight, subsequent encounter: Secondary | ICD-10-CM | POA: Diagnosis not present

## 2020-03-04 DIAGNOSIS — C44319 Basal cell carcinoma of skin of other parts of face: Secondary | ICD-10-CM | POA: Diagnosis not present

## 2020-04-02 DIAGNOSIS — L570 Actinic keratosis: Secondary | ICD-10-CM | POA: Diagnosis not present

## 2020-04-02 DIAGNOSIS — C44319 Basal cell carcinoma of skin of other parts of face: Secondary | ICD-10-CM | POA: Diagnosis not present

## 2020-04-02 DIAGNOSIS — X32XXXD Exposure to sunlight, subsequent encounter: Secondary | ICD-10-CM | POA: Diagnosis not present

## 2020-04-17 DIAGNOSIS — Z20828 Contact with and (suspected) exposure to other viral communicable diseases: Secondary | ICD-10-CM | POA: Diagnosis not present

## 2020-04-28 DIAGNOSIS — Z955 Presence of coronary angioplasty implant and graft: Secondary | ICD-10-CM | POA: Diagnosis not present

## 2020-04-28 DIAGNOSIS — Z0000 Encounter for general adult medical examination without abnormal findings: Secondary | ICD-10-CM | POA: Diagnosis not present

## 2020-04-28 DIAGNOSIS — E7800 Pure hypercholesterolemia, unspecified: Secondary | ICD-10-CM | POA: Diagnosis not present

## 2020-04-28 DIAGNOSIS — I10 Essential (primary) hypertension: Secondary | ICD-10-CM | POA: Diagnosis not present

## 2020-04-28 DIAGNOSIS — E669 Obesity, unspecified: Secondary | ICD-10-CM | POA: Diagnosis not present

## 2020-04-28 DIAGNOSIS — Z1389 Encounter for screening for other disorder: Secondary | ICD-10-CM | POA: Diagnosis not present

## 2020-04-28 DIAGNOSIS — G4700 Insomnia, unspecified: Secondary | ICD-10-CM | POA: Diagnosis not present

## 2020-04-28 DIAGNOSIS — R7303 Prediabetes: Secondary | ICD-10-CM | POA: Diagnosis not present

## 2020-08-05 DIAGNOSIS — I2510 Atherosclerotic heart disease of native coronary artery without angina pectoris: Secondary | ICD-10-CM | POA: Diagnosis not present

## 2020-08-05 DIAGNOSIS — F419 Anxiety disorder, unspecified: Secondary | ICD-10-CM | POA: Diagnosis not present

## 2020-08-05 DIAGNOSIS — R509 Fever, unspecified: Secondary | ICD-10-CM | POA: Diagnosis not present

## 2020-08-05 DIAGNOSIS — Z955 Presence of coronary angioplasty implant and graft: Secondary | ICD-10-CM | POA: Diagnosis not present

## 2020-08-05 DIAGNOSIS — I10 Essential (primary) hypertension: Secondary | ICD-10-CM | POA: Diagnosis not present

## 2020-08-05 DIAGNOSIS — U071 COVID-19: Secondary | ICD-10-CM | POA: Diagnosis not present

## 2020-08-05 DIAGNOSIS — R7303 Prediabetes: Secondary | ICD-10-CM | POA: Diagnosis not present

## 2020-08-05 DIAGNOSIS — E669 Obesity, unspecified: Secondary | ICD-10-CM | POA: Diagnosis not present

## 2020-08-06 ENCOUNTER — Telehealth: Payer: Self-pay | Admitting: Adult Health

## 2020-08-06 ENCOUNTER — Encounter: Payer: Self-pay | Admitting: Adult Health

## 2020-08-06 NOTE — Telephone Encounter (Signed)
Called to discuss with patient about COVID-19 symptoms and the use of one of the available treatments for those with mild to moderate Covid symptoms and at a high risk of hospitalization.  Pt appears to qualify for outpatient treatment due to co-morbid conditions and/or a member of an at-risk group in accordance with the FDA Emergency Use Authorization.     Unable to reach pt - LMOM, my chart message sent   Scot Dock

## 2020-08-19 DIAGNOSIS — Z08 Encounter for follow-up examination after completed treatment for malignant neoplasm: Secondary | ICD-10-CM | POA: Diagnosis not present

## 2020-08-19 DIAGNOSIS — Z85828 Personal history of other malignant neoplasm of skin: Secondary | ICD-10-CM | POA: Diagnosis not present

## 2020-08-19 DIAGNOSIS — L570 Actinic keratosis: Secondary | ICD-10-CM | POA: Diagnosis not present

## 2020-08-19 DIAGNOSIS — L820 Inflamed seborrheic keratosis: Secondary | ICD-10-CM | POA: Diagnosis not present

## 2020-08-19 DIAGNOSIS — X32XXXD Exposure to sunlight, subsequent encounter: Secondary | ICD-10-CM | POA: Diagnosis not present

## 2020-09-09 DIAGNOSIS — M13862 Other specified arthritis, left knee: Secondary | ICD-10-CM | POA: Diagnosis not present

## 2020-10-16 DIAGNOSIS — H0102A Squamous blepharitis right eye, upper and lower eyelids: Secondary | ICD-10-CM | POA: Diagnosis not present

## 2020-10-16 DIAGNOSIS — H5213 Myopia, bilateral: Secondary | ICD-10-CM | POA: Diagnosis not present

## 2020-10-16 DIAGNOSIS — D3131 Benign neoplasm of right choroid: Secondary | ICD-10-CM | POA: Diagnosis not present

## 2020-10-16 DIAGNOSIS — Z961 Presence of intraocular lens: Secondary | ICD-10-CM | POA: Diagnosis not present

## 2020-10-16 DIAGNOSIS — H43812 Vitreous degeneration, left eye: Secondary | ICD-10-CM | POA: Diagnosis not present

## 2020-10-16 DIAGNOSIS — H0102B Squamous blepharitis left eye, upper and lower eyelids: Secondary | ICD-10-CM | POA: Diagnosis not present

## 2020-10-16 DIAGNOSIS — H04123 Dry eye syndrome of bilateral lacrimal glands: Secondary | ICD-10-CM | POA: Diagnosis not present

## 2020-10-17 DIAGNOSIS — Z86010 Personal history of colonic polyps: Secondary | ICD-10-CM | POA: Diagnosis not present

## 2020-10-17 DIAGNOSIS — I10 Essential (primary) hypertension: Secondary | ICD-10-CM | POA: Diagnosis not present

## 2020-10-17 DIAGNOSIS — R7303 Prediabetes: Secondary | ICD-10-CM | POA: Diagnosis not present

## 2020-10-17 DIAGNOSIS — I2510 Atherosclerotic heart disease of native coronary artery without angina pectoris: Secondary | ICD-10-CM | POA: Diagnosis not present

## 2020-10-17 DIAGNOSIS — Z955 Presence of coronary angioplasty implant and graft: Secondary | ICD-10-CM | POA: Diagnosis not present

## 2020-10-17 DIAGNOSIS — Z125 Encounter for screening for malignant neoplasm of prostate: Secondary | ICD-10-CM | POA: Diagnosis not present

## 2020-10-17 DIAGNOSIS — E7800 Pure hypercholesterolemia, unspecified: Secondary | ICD-10-CM | POA: Diagnosis not present

## 2020-10-20 DIAGNOSIS — R7303 Prediabetes: Secondary | ICD-10-CM | POA: Diagnosis not present

## 2020-10-20 DIAGNOSIS — E7800 Pure hypercholesterolemia, unspecified: Secondary | ICD-10-CM | POA: Diagnosis not present

## 2020-10-20 DIAGNOSIS — Z955 Presence of coronary angioplasty implant and graft: Secondary | ICD-10-CM | POA: Diagnosis not present

## 2020-10-20 DIAGNOSIS — I10 Essential (primary) hypertension: Secondary | ICD-10-CM | POA: Diagnosis not present

## 2020-10-20 DIAGNOSIS — I2510 Atherosclerotic heart disease of native coronary artery without angina pectoris: Secondary | ICD-10-CM | POA: Diagnosis not present

## 2020-11-11 DIAGNOSIS — M170 Bilateral primary osteoarthritis of knee: Secondary | ICD-10-CM | POA: Diagnosis not present

## 2020-11-17 DIAGNOSIS — E7800 Pure hypercholesterolemia, unspecified: Secondary | ICD-10-CM | POA: Diagnosis not present

## 2020-11-17 DIAGNOSIS — G8929 Other chronic pain: Secondary | ICD-10-CM | POA: Diagnosis not present

## 2020-11-17 DIAGNOSIS — R7303 Prediabetes: Secondary | ICD-10-CM | POA: Diagnosis not present

## 2020-11-17 DIAGNOSIS — M25561 Pain in right knee: Secondary | ICD-10-CM | POA: Diagnosis not present

## 2020-11-17 DIAGNOSIS — I10 Essential (primary) hypertension: Secondary | ICD-10-CM | POA: Diagnosis not present

## 2020-11-17 DIAGNOSIS — M25562 Pain in left knee: Secondary | ICD-10-CM | POA: Diagnosis not present

## 2020-11-17 DIAGNOSIS — I2510 Atherosclerotic heart disease of native coronary artery without angina pectoris: Secondary | ICD-10-CM | POA: Diagnosis not present

## 2020-11-17 DIAGNOSIS — Z955 Presence of coronary angioplasty implant and graft: Secondary | ICD-10-CM | POA: Diagnosis not present

## 2020-11-25 NOTE — Progress Notes (Signed)
Cardiology Office Note:    Date:  11/26/2020   ID:  Zebastian, Beahan Feb 23, 1946, MRN [id number]  PCP:  Leighton Ruff, MD   [place] Prov[facility] Cardiologist:  Sherren Mocha, MD Cardiology APP:  Sharmon Revere      Referring MD: Leighton Ruff, MD   Chief Complaint:  Follow-up (CAD)    Patient Profile:    CAROS MERGEL is a 75 y.o. male with:  Coronary artery disease  S/p DES x 2 to LCx in 2013 Hypertension w/ whit coat component Hyperlipidemia    Prior CV studies: ETT 11/23/17 Positive ETT 1 mm horizontal ST segment depression in inferior lateral leads with stress HTN response to exercise Per Dr. Burt Knack: Borderline changes w/ exercise, no angina >> continue med Rx   Echocardiogram 06/25/14 EF 55-60, no RWMA, Gr 1 DD   Myoview 06/12/13 Probable normal perfusion and very mild soft tissue attenuation (diaphragm)  No significant ischemia or scar.  LV Ejection Fraction: 62%.     Cardiac catheterization 11/2011 LM 20 ost, LAD 40 ost, 30-40p, 60-49m D1 mild plaquing, LCX 70-80 p-m (FFR 0.78, treated with  3.0x20 and 2.75x16 Promus DES), RCA 30-474mEF 55%, no rwma   History of Present Illness: Mr. JeJurasas last seen in 8/21.  He returns for annual Cardiology f/u.  He is here alone.  He is doing well.  He has not had chest pain, shortness of breath, syncope.  He walks on a daily basis and plays a lot of golf.  He thinks that he may need surgery on his right knee at some point in near future.  He has an MRI scheduled soon.    Past Medical History:  Diagnosis Date   Anemia    Anxiety    Arthritis    "hands, knees, shoulders" (06/24/2014)   CAD (coronary artery disease)    a. 11/2011 Cath: LM 20 ost, LAD 40 ost, 30-40p, 60-7050m1 mild plaquing, LCX 70-80 p-m (FFR 0.78, treated with  3.0x20 and 2.75x16 Promus DES), RCA 30-85m1m 55%, no rwma.;  b.  ETT-Myoview (06/2013):  Ex 7:00, no ST changes, soft tissue atten, no scar or ischemia, EF 62%;  06/2014 MV:  EF 56%, no isch/infarct.   H/O echocardiogram    a. 06/2014 Echo: EF 55-60%, Gr 1 DD.   Hyperlipidemia    Hypertension     Current Medications: Current Meds  Medication Sig   amoxicillin (AMOXIL) 500 MG capsule    aspirin EC 81 MG tablet Take 81 mg by mouth daily.   atorvastatin (LIPITOR) 40 MG tablet Take 1 tablet (40 mg total) by mouth daily at 6 PM.   benazepril (LOTENSIN) 40 MG tablet Take 40 mg by mouth 2 (two) times daily.   CALCIUM PO Take 1 tablet by mouth daily.   chlorhexidine (PERIDEX) 0.12 % solution Use as directed 5 mLs in the mouth or throat 2 (two) times daily.    Coenzyme Q10 (CO Q 10 PO) Take 10 mLs by mouth daily.   Glucosamine HCl (GLUCOSAMINE PO) Take 1 tablet by mouth 2 (two) times daily.    metoprolol succinate (TOPROL-XL) 50 MG 24 hr tablet Take 1 tablet (50 mg total) by mouth daily.   Multiple Vitamin (MULTIVITAMIN WITH MINERALS) TABS Take 1 tablet by mouth every morning.   nitroGLYCERIN (NITROSTAT) 0.4 MG SL tablet Place 0.4 mg under the tongue every 5 (five) minutes as needed for chest pain.   zolpidem (AMBIEN) 10 MG tablet Take  10 mg by mouth at bedtime as needed for sleep.      Allergies:   Codeine   Social History   Tobacco Use   Smoking status: Never   Smokeless tobacco: Former    Types: Chew   Tobacco comments:    "quit chewing in the 1990's"  Vaping Use   Vaping Use: Never used  Substance Use Topics   Alcohol use: Yes    Comment: 06/24/2014 "drink a beer [place] the golfcourse once in awhile"   Drug use: No     Family Hx: The patient's family history includes Coronary artery disease in his brother; Diabetes type I in his brother and mother; Heart attack in his brother; Other in his mother; Pneumonia in his father.  Review of Systems  Cardiovascular:  Negative for claudication.    EKGs/Labs/Other Test Reviewed:    EKG:  EKG is ordered today.  The ekg ordered today demonstrates NSR, HR 65, normal axis, no ST-T wave changes, QTC 413  Recent  Labs: No results found for requested labs within last 8760 hours.   Recent Lipid Panel Lab Results  Component Value Date/Time   CHOL 113 08/21/2012 07:44 AM   TRIG 63.0 08/21/2012 07:44 AM   HDL 42.60 08/21/2012 07:44 AM   LDLCALC 58 08/21/2012 07:44 AM      Risk Assessment/Calculations:      Physical Exam:    VS:  BP 132/70   Pulse 65   Ht '5\' 7"'$  (1.702 m)   Wt 182 lb 9.6 oz (82.8 kg)   SpO2 96%   BMI 28.60 kg/m     Wt Readings from Last 3 Encounters:  11/26/20 182 lb 9.6 oz (82.8 kg)  11/27/19 192 lb (87.1 kg)  11/21/18 197 lb 12.8 oz (89.7 kg)     Constitutional:      Appearance: Healthy appearance. Not in distress.  Neck:     Vascular: No carotid bruit. JVD normal.  Pulmonary:     Effort: Pulmonary effort is normal.     Breath sounds: No wheezing. No rales.  Cardiovascular:     Normal rate. Regular rhythm. Normal S1. Normal S2.      Murmurs: There is no murmur.  Edema:    Peripheral edema absent.  Abdominal:     Palpations: Abdomen is soft.  Skin:    General: Skin is warm and dry.  Neurological:     General: No focal deficit present.     Mental Status: Alert and oriented to person, place and time.     Cranial Nerves: Cranial nerves are intact.        ASSESSMENT & PLAN:    1. Coronary artery disease involving native coronary artery of native heart without angina pectoris S/p DES to LCx in 2013.  He is doing well without anginal symptoms.  Continue ASA 81 mg once daily, Atorvastatin 40 mg once daily, metoprolol succinate 50 mg once daily.  F/u in 1 year.   2. Essential hypertension BP was elevated recently but he was taking a lot of NSAIDs for his knee and drinking electrolyte water.  His BP is much better now.  Readings at home are optimal.  Continue benazepril 40 mg daily, metoprolol succinate 50 mg daily.  3. Pure hypercholesterolemia LDL optimal on most recent lab work.  Continue atorvastatin 40 mg daily.    4. Arthritis of right knee He notes  he will likely need surgery on his right knee at some point in near future.  Currently,  his risk of perioperative major cardiac event is low at 0.9%.  He can clearly achieve more than 4 METS.  Should he need surgery in the next several months, he will be able to proceed at acceptable risk.         Dispo:  Return in about 1 year (around 11/26/2021) for Routine follow up in 1 year with Dr. Burt Knack or [facility]..   Medication Adjustments/Labs and Tests Ordered: Current medicines are reviewed at length with the patient today.  Concerns regarding medicines are outlined above.  Tests Ordered: Orders Placed This Encounter  Procedures   EKG 12-Lead   Medication Changes: No orders of the defined types were placed in this encounter.   Signed, Richardson Dopp, PA-C  11/26/2020 10:10 AM    [facility] [place], [place], [place]  [address] Phone: [phone]; Fax: [phone]

## 2020-11-26 ENCOUNTER — Encounter: Payer: Self-pay | Admitting: Physician Assistant

## 2020-11-26 ENCOUNTER — Other Ambulatory Visit: Payer: Self-pay

## 2020-11-26 ENCOUNTER — Ambulatory Visit: Payer: Medicare PPO | Admitting: Physician Assistant

## 2020-11-26 VITALS — BP 132/70 | HR 65 | Ht 67.0 in | Wt 182.6 lb

## 2020-11-26 DIAGNOSIS — I2510 Atherosclerotic heart disease of native coronary artery without angina pectoris: Secondary | ICD-10-CM

## 2020-11-26 DIAGNOSIS — I10 Essential (primary) hypertension: Secondary | ICD-10-CM | POA: Diagnosis not present

## 2020-11-26 DIAGNOSIS — M1711 Unilateral primary osteoarthritis, right knee: Secondary | ICD-10-CM | POA: Diagnosis not present

## 2020-11-26 DIAGNOSIS — E7800 Pure hypercholesterolemia, unspecified: Secondary | ICD-10-CM | POA: Diagnosis not present

## 2020-11-26 NOTE — Patient Instructions (Signed)
Medication Instructions:   Your physician recommends that you continue on your current medications as directed. Please refer to the Current Medication list given to you today.  *If you need a refill on your cardiac medications before your next appointment, please call your pharmacy*   Lab Work:  -None  If you have labs (blood work) drawn today and your tests are completely normal, you will receive your results only by: [place] (if you have MyChart) OR A paper copy in the mail If you have any lab test that is abnormal or we need to change your treatment, we will call you to review the results.   Testing/Procedures:  -None  Follow-Up: At [facility], you and your health needs are our priority.  As part of our continuing mission to provide you with exceptional heart care, we have created designated Provider Care Teams.  These Care Teams include your primary Cardiologist (physician) and Advanced Practice Providers (APPs -  Physician Assistants and Nurse Practitioners) who all work together to provide you with the care you need, when you need it.  We recommend signing up for the patient portal called "MyChart".  Sign up information is provided on this After Visit Summary.  MyChart is used to connect with patients for Virtual Visits (Telemedicine).  Patients are able to view lab/test results, encounter notes, upcoming appointments, etc.  Non-urgent messages can be sent to your provider as well.   To learn more about what you can do with MyChart, go to [email or web address].    Your next appointment:   1 year(s)  The format for your next appointment:   In Person  Provider:   You may see Sherren Mocha, MD or one of the following Advanced Practice Providers on your designated Care Team:   Richardson Dopp, PA-C   Other Instructions Your physician wants you to follow-up in: 1 year with Dr. Burt Knack or Richardson Dopp, PA-C.  You will receive a reminder letter in the mail two months  in advance. If you don't receive a letter, please call our office to schedule the follow-up appointment.

## 2020-11-29 DIAGNOSIS — M25561 Pain in right knee: Secondary | ICD-10-CM | POA: Diagnosis not present

## 2020-12-10 DIAGNOSIS — S83231D Complex tear of medial meniscus, current injury, right knee, subsequent encounter: Secondary | ICD-10-CM | POA: Diagnosis not present

## 2020-12-10 DIAGNOSIS — M25561 Pain in right knee: Secondary | ICD-10-CM | POA: Diagnosis not present

## 2020-12-10 DIAGNOSIS — M2241 Chondromalacia patellae, right knee: Secondary | ICD-10-CM | POA: Diagnosis not present

## 2020-12-15 DIAGNOSIS — R7303 Prediabetes: Secondary | ICD-10-CM | POA: Diagnosis not present

## 2020-12-15 DIAGNOSIS — G8929 Other chronic pain: Secondary | ICD-10-CM | POA: Diagnosis not present

## 2020-12-15 DIAGNOSIS — M25562 Pain in left knee: Secondary | ICD-10-CM | POA: Diagnosis not present

## 2020-12-15 DIAGNOSIS — I10 Essential (primary) hypertension: Secondary | ICD-10-CM | POA: Diagnosis not present

## 2020-12-15 DIAGNOSIS — Z955 Presence of coronary angioplasty implant and graft: Secondary | ICD-10-CM | POA: Diagnosis not present

## 2020-12-15 DIAGNOSIS — I2510 Atherosclerotic heart disease of native coronary artery without angina pectoris: Secondary | ICD-10-CM | POA: Diagnosis not present

## 2020-12-15 DIAGNOSIS — M25561 Pain in right knee: Secondary | ICD-10-CM | POA: Diagnosis not present

## 2020-12-15 DIAGNOSIS — E7800 Pure hypercholesterolemia, unspecified: Secondary | ICD-10-CM | POA: Diagnosis not present

## 2021-04-15 DIAGNOSIS — Z1283 Encounter for screening for malignant neoplasm of skin: Secondary | ICD-10-CM | POA: Diagnosis not present

## 2021-04-15 DIAGNOSIS — D225 Melanocytic nevi of trunk: Secondary | ICD-10-CM | POA: Diagnosis not present

## 2021-04-15 DIAGNOSIS — X32XXXD Exposure to sunlight, subsequent encounter: Secondary | ICD-10-CM | POA: Diagnosis not present

## 2021-04-15 DIAGNOSIS — L570 Actinic keratosis: Secondary | ICD-10-CM | POA: Diagnosis not present

## 2021-04-15 DIAGNOSIS — B078 Other viral warts: Secondary | ICD-10-CM | POA: Diagnosis not present

## 2021-04-29 DIAGNOSIS — E7800 Pure hypercholesterolemia, unspecified: Secondary | ICD-10-CM | POA: Diagnosis not present

## 2021-04-29 DIAGNOSIS — R7303 Prediabetes: Secondary | ICD-10-CM | POA: Diagnosis not present

## 2021-04-29 DIAGNOSIS — Z862 Personal history of diseases of the blood and blood-forming organs and certain disorders involving the immune mechanism: Secondary | ICD-10-CM | POA: Diagnosis not present

## 2021-05-01 DIAGNOSIS — R7303 Prediabetes: Secondary | ICD-10-CM | POA: Diagnosis not present

## 2021-05-01 DIAGNOSIS — Z0000 Encounter for general adult medical examination without abnormal findings: Secondary | ICD-10-CM | POA: Diagnosis not present

## 2021-05-01 DIAGNOSIS — Z125 Encounter for screening for malignant neoplasm of prostate: Secondary | ICD-10-CM | POA: Diagnosis not present

## 2021-05-01 DIAGNOSIS — Z955 Presence of coronary angioplasty implant and graft: Secondary | ICD-10-CM | POA: Diagnosis not present

## 2021-05-01 DIAGNOSIS — E7800 Pure hypercholesterolemia, unspecified: Secondary | ICD-10-CM | POA: Diagnosis not present

## 2021-05-01 DIAGNOSIS — Z862 Personal history of diseases of the blood and blood-forming organs and certain disorders involving the immune mechanism: Secondary | ICD-10-CM | POA: Diagnosis not present

## 2021-05-01 DIAGNOSIS — I10 Essential (primary) hypertension: Secondary | ICD-10-CM | POA: Diagnosis not present

## 2021-05-01 DIAGNOSIS — I2510 Atherosclerotic heart disease of native coronary artery without angina pectoris: Secondary | ICD-10-CM | POA: Diagnosis not present

## 2021-06-11 DIAGNOSIS — M25511 Pain in right shoulder: Secondary | ICD-10-CM | POA: Diagnosis not present

## 2021-06-11 DIAGNOSIS — M170 Bilateral primary osteoarthritis of knee: Secondary | ICD-10-CM | POA: Diagnosis not present

## 2021-11-26 NOTE — Progress Notes (Signed)
Cardiology Office Note:    Date:  11/27/2021   ID:  Randy [name], Randy [name] 12-05-1945, MRN [id number]  PCP:  Leighton Ruff, MD (Inactive)  [place] [facility] Cardiologist:  Sherren Mocha, MD Cardiology APP:  Sharmon Revere    Referring MD: No ref. provider found   Chief Complaint:  F/u for CAD    Patient Profile: Coronary artery disease  S/p DES x 2 to LCx in 2013 Hypertension w/ whit coat component Hyperlipidemia   Prior CV Studies: ETT 11/23/17 Positive ETT 1 mm horizontal ST segment depression in inferior lateral leads with stress HTN response to exercise Per Dr. Burt Knack: Borderline changes w/ exercise, no angina >> continue med Rx   Echocardiogram 06/25/14 EF 55-60, no RWMA, Gr 1 DD   Myoview 06/12/13 Probable normal perfusion and very mild soft tissue attenuation (diaphragm)  No significant ischemia or scar.  LV Ejection Fraction: 62%.     Cardiac catheterization 11/2011 LM 20 ost, LAD 40 ost, 30-40p, 60-7m D1 mild plaquing, LCX 70-80 p-m (FFR 0.78, treated with  3.0x20 and 2.75x16 Promus DES), RCA 30-437mEF 55%, no rwma    History of Present Illness:   Randy AMORESs a [age].o. male with the above problem list.  He was last seen in Aug 22. He returns for Cardiology f/u.  He is here alone.  He continues to play a lot of golf.  His blood pressures have been optimal at home since he was placed on hydrochlorothiazide.  He also stopped taking Aleve on a daily basis.  He has not had chest discomfort, shortness of breath, syncope.        Past Medical History:  Diagnosis Date   Anemia    Anxiety    Arthritis    "hands, knees, shoulders" (06/24/2014)   CAD (coronary artery disease)    a. 11/2011 Cath: LM 20 ost, LAD 40 ost, 30-40p, 60-7052m1 mild plaquing, LCX 70-80 p-m (FFR 0.78, treated with  3.0x20 and 2.75x16 Promus DES), RCA 30-12m48m 55%, no rwma.;  b.  ETT-Myoview (06/2013):  Ex 7:00, no ST changes, soft tissue atten, no scar or ischemia, EF  62%;  06/2014 MV: EF 56%, no isch/infarct.   H/O echocardiogram    a. 06/2014 Echo: EF 55-60%, Gr 1 DD.   Hyperlipidemia    Hypertension    Current Medications: Current Meds  Medication Sig   aspirin EC 81 MG tablet Take 81 mg by mouth daily.   atorvastatin (LIPITOR) 40 MG tablet Take 1 tablet (40 mg total) by mouth daily at 6 PM.   benazepril (LOTENSIN) 40 MG tablet Take 40 mg by mouth 2 (two) times daily.   CALCIUM PO Take 1 tablet by mouth daily.   Coenzyme Q10 (CO Q 10 PO) Take 10 mLs by mouth daily.   hydrochlorothiazide (HYDRODIURIL) 25 MG tablet Take 1 tablet by mouth every morning.   metoprolol succinate (TOPROL-XL) 50 MG 24 hr tablet Take 1 tablet (50 mg total) by mouth daily.   Multiple Vitamin (MULTIVITAMIN WITH MINERALS) TABS Take 1 tablet by mouth every morning.   nitroGLYCERIN (NITROSTAT) 0.4 MG SL tablet Place 0.4 mg under the tongue every 5 (five) minutes as needed for chest pain.   zolpidem (AMBIEN) 10 MG tablet Take 10 mg by mouth at bedtime as needed for sleep.     Allergies:   Codeine   Social History   Tobacco Use   Smoking status: Never   Smokeless tobacco: Former  Types: Chew   Tobacco comments:    "quit chewing in the 1990's"  Vaping Use   Vaping Use: Never used  Substance Use Topics   Alcohol use: Yes    Comment: 06/24/2014 "drink a beer on the golfcourse once in awhile"   Drug use: No    Family Hx: The patient's family history includes Coronary artery disease in his brother; Diabetes type I in his brother and mother; Heart attack in his brother; Other in his mother; Pneumonia in his father.  Review of Systems  Cardiovascular:  Negative for claudication.  Musculoskeletal:  Positive for joint pain.     EKGs/Labs/Other Test Reviewed:    EKG:  EKG is   ordered today.  The ekg ordered today demonstrates Normal sinus rhythm, HR 66, normal axis, septal Q waves, no ST-T wave changes, QTc 404, similar to prior tracing  Recent Labs: No results found  for requested labs within last 365 days.   Recent Lipid Panel No results for input(s): "CHOL", "TRIG", "HDL", "VLDL", "LDLCALC", "LDLDIRECT" in the last 8760 hours.   Risk Assessment/Calculations/Metrics:              Physical Exam:    VS:  BP 126/62   Pulse 66   Ht '5\' 7"'$  (1.702 m)   Wt 187 lb (84.8 kg)   SpO2 97%   BMI 29.29 kg/m     Wt Readings from Last 3 Encounters:  11/27/21 187 lb (84.8 kg)  11/26/20 182 lb 9.6 oz (82.8 kg)  11/27/19 192 lb (87.1 kg)    Constitutional:      Appearance: Healthy appearance. Not in distress.  Neck:     Vascular: No carotid bruit. JVD normal.  Pulmonary:     Effort: Pulmonary effort is normal.     Breath sounds: No wheezing. No rales.  Cardiovascular:     Normal rate. Regular rhythm. Normal S1. Normal S2.      Murmurs: There is no murmur.  Edema:    Peripheral edema absent.  Abdominal:     Palpations: Abdomen is soft.  Skin:    General: Skin is warm and dry.  Neurological:     General: No focal deficit present.     Mental Status: Alert and oriented to person, place and time.         ASSESSMENT & PLAN:   Coronary artery disease involving native coronary artery of native heart without angina pectoris History of DES x2 to the LCx in 2013.  Exercise tolerance test in 2019 with borderline changes.  He is doing well without anginal symptoms.  Continue current regimen which includes aspirin, atorvastatin, metoprolol succinate.  Follow-up in 1 year.  Hyperlipidemia LDL goal <70 LDL optimal on current dose of atorvastatin.  Continue current management.  Essential hypertension Blood pressure well controlled on a combination of benazepril, metoprolol succinate, hydrochlorothiazide.            Dispo:  Return in about 1 year (around 11/28/2022) for Routine Follow Up, w/ Dr. Burt Knack, or Richardson Dopp, PA-C.   Medication Adjustments/Labs and Tests Ordered: Current medicines are reviewed at length with the patient today.  Concerns  regarding medicines are outlined above.  Tests Ordered: Orders Placed This Encounter  Procedures   EKG 12-Lead   Medication Changes: No orders of the defined types were placed in this encounter.  Signed, Richardson Dopp, PA-C  11/27/2021 10:02 AM    [facility] [address], [place], [place]  [address] Phone: [phone];  Fax: [phone]  

## 2021-11-27 ENCOUNTER — Ambulatory Visit: Payer: Medicare PPO | Admitting: Physician Assistant

## 2021-11-27 ENCOUNTER — Encounter: Payer: Self-pay | Admitting: Physician Assistant

## 2021-11-27 VITALS — BP 126/62 | HR 66 | Ht 67.0 in | Wt 187.0 lb

## 2021-11-27 DIAGNOSIS — E785 Hyperlipidemia, unspecified: Secondary | ICD-10-CM | POA: Diagnosis not present

## 2021-11-27 DIAGNOSIS — I2510 Atherosclerotic heart disease of native coronary artery without angina pectoris: Secondary | ICD-10-CM

## 2021-11-27 DIAGNOSIS — I10 Essential (primary) hypertension: Secondary | ICD-10-CM | POA: Diagnosis not present

## 2021-11-27 NOTE — Assessment & Plan Note (Signed)
Blood pressure well controlled on a combination of benazepril, metoprolol succinate, hydrochlorothiazide.

## 2021-11-27 NOTE — Assessment & Plan Note (Signed)
History of DES x2 to the LCx in 2013.  Exercise tolerance test in 2019 with borderline changes.  He is doing well without anginal symptoms.  Continue current regimen which includes aspirin, atorvastatin, metoprolol succinate.  Follow-up in 1 year.

## 2021-11-27 NOTE — Assessment & Plan Note (Signed)
LDL optimal on current dose of atorvastatin.  Continue current management.

## 2021-11-27 NOTE — Patient Instructions (Signed)
Medication Instructions:  Your physician recommends that you continue on your current medications as directed. Please refer to the Current Medication list given to you today.  *If you need a refill on your cardiac medications before your next appointment, please call your pharmacy*   Lab Work: None ordered  If you have labs (blood work) drawn today and your tests are completely normal, you will receive your results only by: [place] (if you have MyChart) OR A paper copy in the mail If you have any lab test that is abnormal or we need to change your treatment, we will call you to review the results.   Testing/Procedures: None ordered   Follow-Up: At [facility], you and your health needs are our priority.  As part of our continuing mission to provide you with exceptional heart care, we have created designated Provider Care Teams.  These Care Teams include your primary Cardiologist (physician) and Advanced Practice Providers (APPs -  Physician Assistants and Nurse Practitioners) who all work together to provide you with the care you need, when you need it.  We recommend signing up for the patient portal called "MyChart".  Sign up information is provided on this After Visit Summary.  MyChart is used to connect with patients for Virtual Visits (Telemedicine).  Patients are able to view lab/test results, encounter notes, upcoming appointments, etc.  Non-urgent messages can be sent to your provider as well.   To learn more about what you can do with MyChart, go to [email or web address].    Your next appointment:   1 year(s)  The format for your next appointment:   In Person  Provider:   Sherren Mocha, MD  or Richardson Dopp, PA-C         Other Instructions   Important Information About Sugar

## 2022-04-20 DIAGNOSIS — Z1283 Encounter for screening for malignant neoplasm of skin: Secondary | ICD-10-CM | POA: Diagnosis not present

## 2022-04-20 DIAGNOSIS — X32XXXD Exposure to sunlight, subsequent encounter: Secondary | ICD-10-CM | POA: Diagnosis not present

## 2022-04-20 DIAGNOSIS — C4441 Basal cell carcinoma of skin of scalp and neck: Secondary | ICD-10-CM | POA: Diagnosis not present

## 2022-04-20 DIAGNOSIS — Z08 Encounter for follow-up examination after completed treatment for malignant neoplasm: Secondary | ICD-10-CM | POA: Diagnosis not present

## 2022-04-20 DIAGNOSIS — Z85828 Personal history of other malignant neoplasm of skin: Secondary | ICD-10-CM | POA: Diagnosis not present

## 2022-04-20 DIAGNOSIS — D225 Melanocytic nevi of trunk: Secondary | ICD-10-CM | POA: Diagnosis not present

## 2022-04-20 DIAGNOSIS — L570 Actinic keratosis: Secondary | ICD-10-CM | POA: Diagnosis not present

## 2022-05-05 DIAGNOSIS — Z0000 Encounter for general adult medical examination without abnormal findings: Secondary | ICD-10-CM | POA: Diagnosis not present

## 2022-05-05 DIAGNOSIS — I25119 Atherosclerotic heart disease of native coronary artery with unspecified angina pectoris: Secondary | ICD-10-CM | POA: Diagnosis not present

## 2022-05-05 DIAGNOSIS — Z125 Encounter for screening for malignant neoplasm of prostate: Secondary | ICD-10-CM | POA: Diagnosis not present

## 2022-05-05 DIAGNOSIS — E7800 Pure hypercholesterolemia, unspecified: Secondary | ICD-10-CM | POA: Diagnosis not present

## 2022-05-05 DIAGNOSIS — G4700 Insomnia, unspecified: Secondary | ICD-10-CM | POA: Diagnosis not present

## 2022-05-05 DIAGNOSIS — Z955 Presence of coronary angioplasty implant and graft: Secondary | ICD-10-CM | POA: Diagnosis not present

## 2022-05-05 DIAGNOSIS — I10 Essential (primary) hypertension: Secondary | ICD-10-CM | POA: Diagnosis not present

## 2022-05-13 DIAGNOSIS — M25561 Pain in right knee: Secondary | ICD-10-CM | POA: Diagnosis not present

## 2022-05-13 DIAGNOSIS — M25511 Pain in right shoulder: Secondary | ICD-10-CM | POA: Diagnosis not present

## 2022-05-13 DIAGNOSIS — M25562 Pain in left knee: Secondary | ICD-10-CM | POA: Diagnosis not present

## 2022-05-13 DIAGNOSIS — M25512 Pain in left shoulder: Secondary | ICD-10-CM | POA: Diagnosis not present

## 2022-06-01 DIAGNOSIS — L308 Other specified dermatitis: Secondary | ICD-10-CM | POA: Diagnosis not present

## 2022-06-01 DIAGNOSIS — Z08 Encounter for follow-up examination after completed treatment for malignant neoplasm: Secondary | ICD-10-CM | POA: Diagnosis not present

## 2022-06-01 DIAGNOSIS — Z85828 Personal history of other malignant neoplasm of skin: Secondary | ICD-10-CM | POA: Diagnosis not present

## 2022-06-01 DIAGNOSIS — L508 Other urticaria: Secondary | ICD-10-CM | POA: Diagnosis not present

## 2022-08-31 DIAGNOSIS — M25561 Pain in right knee: Secondary | ICD-10-CM | POA: Diagnosis not present

## 2022-08-31 DIAGNOSIS — M25562 Pain in left knee: Secondary | ICD-10-CM | POA: Diagnosis not present

## 2022-10-28 DIAGNOSIS — Z961 Presence of intraocular lens: Secondary | ICD-10-CM | POA: Diagnosis not present

## 2022-10-28 DIAGNOSIS — H43812 Vitreous degeneration, left eye: Secondary | ICD-10-CM | POA: Diagnosis not present

## 2022-11-03 DIAGNOSIS — I10 Essential (primary) hypertension: Secondary | ICD-10-CM | POA: Diagnosis not present

## 2022-11-03 DIAGNOSIS — E7800 Pure hypercholesterolemia, unspecified: Secondary | ICD-10-CM | POA: Diagnosis not present

## 2022-11-03 DIAGNOSIS — D692 Other nonthrombocytopenic purpura: Secondary | ICD-10-CM | POA: Diagnosis not present

## 2022-11-23 ENCOUNTER — Ambulatory Visit: Payer: Medicare PPO | Attending: Physician Assistant | Admitting: Physician Assistant

## 2022-11-23 ENCOUNTER — Encounter: Payer: Self-pay | Admitting: Physician Assistant

## 2022-11-23 VITALS — BP 124/72 | HR 83 | Ht 66.0 in | Wt 185.0 lb

## 2022-11-23 DIAGNOSIS — I10 Essential (primary) hypertension: Secondary | ICD-10-CM

## 2022-11-23 DIAGNOSIS — E785 Hyperlipidemia, unspecified: Secondary | ICD-10-CM

## 2022-11-23 DIAGNOSIS — I2510 Atherosclerotic heart disease of native coronary artery without angina pectoris: Secondary | ICD-10-CM

## 2022-11-23 DIAGNOSIS — R011 Cardiac murmur, unspecified: Secondary | ICD-10-CM

## 2022-11-23 DIAGNOSIS — I77810 Thoracic aortic ectasia: Secondary | ICD-10-CM

## 2022-11-23 NOTE — Assessment & Plan Note (Signed)
History of DES x2 in left circumflex in 2013.  Myoview negative for ischemia in 2015.  ETT with borderline changes but no symptoms in 2019.  He is doing well without angina or chest discomfort.  -Continue aspirin 81mg  daily, Lipitor 40mg  daily, and metoprolol succinate 50mg  daily  -Follow-up 1 year

## 2022-11-23 NOTE — Assessment & Plan Note (Signed)
Systolic murmur detected on auscultation today. -Order echocardiogram to evaluate

## 2022-11-23 NOTE — Progress Notes (Signed)
Cardiology Office Note:    Date:  11/23/2022  ID:  Rico, Weckesser [birth year], MRN [id number] PCP: Juluis Rainier, MD (Inactive)   [facility] Cardiologist:  Tonny Bollman, MD Cardiology APP:  Beatrice Lecher, PA-C       Patient Profile:      Coronary artery disease  S/p DES x 2 (3 x 20 mm and 2.75 x 16 mm) to LCx in 11/2011 LHC 11/2011: oLM 20, oLAD 40, pLAD 30-40, mLAD 60-70; pLCx 70-80; mRCA 30-40; EF 55 Myoview 06/12/13: Probable normal perfusion and very mild soft tissue attenuation (diaphragm)  No significant ischemia or scar.  LV Ejection Fraction: 62%.    TTE 06/25/14: EF 55-60, no RWMA, Gr 1 DD  ETT 11/23/17: 1 mm horizontal STD inf-lat >> borderline ?'s, no angina >> med Rx Hypertension w/ white coat component Hyperlipidemia            Discussed the use of AI scribe software for clinical note transcription with the patient, who gave verbal consent to proceed.  History of Present Illness   77 year old male who returns for follow up of coronary disease, hypertension, and hyperlipidemia. He reports no chest discomfort, shortness of breath, dizziness, or swelling in the legs. He has noticed easy bruising, which he attributes to aging and aspirin use. He also reports some joint pain due to osteoarthritis, particularly in the knees and shoulders, but no muscle pain. He remains active, mowing the grass twice a week and planning a golf trip.     ROS:  See HPI No claudication +shoulder, knee, L thumb pain    Studies Reviewed:   EKG Interpretation Date/Time:  Tuesday November 23 2022 13:35:55 EDT Ventricular Rate:  71 PR Interval:  176 QRS Duration:  82 QT Interval:  390 QTC Calculation: 423 R Axis:   -7  Text Interpretation: Normal sinus rhythm Low voltage QRS No significant change since Since last tracing Confirmed by Tereso Newcomer [phone]) on 11/23/2022 1:39:40 PM    Risk Assessment/Calculations:             Physical Exam:   VS:  BP 124/72   Pulse  83   Ht 5\' 6"  (1.676 m)   Wt 185 lb (83.9 kg)   SpO2 95%   BMI 29.86 kg/m    Wt Readings from Last 3 Encounters:  11/23/22 185 lb (83.9 kg)  11/27/21 187 lb (84.8 kg)  11/26/20 182 lb 9.6 oz (82.8 kg)    Constitutional:      Appearance: Healthy appearance. Not in distress.  Neck:     Vascular: No carotid bruit or JVR. JVD normal.  Pulmonary:     Breath sounds: Normal breath sounds. No wheezing. No rales.  Cardiovascular:     Normal rate. Regular rhythm.     Murmurs: There is a grade 2/6 systolic murmur at the LLSB.  Edema:    Peripheral edema absent.  Abdominal:     Palpations: Abdomen is soft.        Assessment and Plan:  Coronary artery disease involving native coronary artery of native heart without angina pectoris History of DES x2 in left circumflex in 2013.  Myoview negative for ischemia in 2015.  ETT with borderline changes but no symptoms in 2019.  He is doing well without angina or chest discomfort.  -Continue aspirin 81mg  daily, Lipitor 40mg  daily, and metoprolol succinate 50mg  daily  -Follow-up 1 year  Essential hypertension Well controlled  -Continue benazepril 40mg  twice daily, Hydrochlorothiazide 25mg  daily.  Murmur Systolic murmur detected on auscultation today. -Order echocardiogram to evaluate  Hyperlipidemia LDL goal <70 LDL slightly above goal at 74 in July 2024. -Continue Lipitor 40mg  daily.  -Recheck lipids in six months.   -If LDL remains above 55, consider increasing atorvastatin or adding ezetimibe.       Dispo:  Return in about 1 year (around 11/23/2023) for Routine Follow Up, w/ Dr. Excell Seltzer, or Tereso Newcomer, PA-C.  Signed, Tereso Newcomer, PA-C

## 2022-11-23 NOTE — Patient Instructions (Signed)
Medication Instructions:  Your physician recommends that you continue on your current medications as directed. Please refer to the Current Medication list given to you today.   *If you need a refill on your cardiac medications before your next appointment, please call your pharmacy*   Lab Work: 6 MONTHS: FASTING LIPID  If you have labs (blood work) drawn today and your tests are completely normal, you will receive your results only by: MyChart [place] (if you have MyChart) OR A paper copy in the mail If you have any lab test that is abnormal or we need to change your treatment, we will call you to review the results.   Testing/Procedures: Your physician has requested that you have an echocardiogram. Echocardiography is a painless test that uses sound waves to create images of your heart. It provides your doctor with information about the size and shape of your heart and how well your heart's chambers and valves are working. This procedure takes approximately one hour. There are no restrictions for this procedure. Please do NOT wear cologne, perfume, aftershave, or lotions (deodorant is allowed). Please arrive 15 minutes prior to your appointment time.    Follow-Up: At [facility], you and your health needs are our priority.  As part of our continuing mission to provide you with exceptional heart care, we have created designated Provider Care Teams.  These Care Teams include your primary Cardiologist (physician) and Advanced Practice Providers (APPs -  Physician Assistants and Nurse Practitioners) who all work together to provide you with the care you need, when you need it.  We recommend signing up for the patient portal called "MyChart".  Sign up information is provided on this After Visit Summary.  MyChart is used to connect with patients for Virtual Visits (Telemedicine).  Patients are able to view lab/test results, encounter notes, upcoming appointments, etc.  Non-urgent  messages can be sent to your provider as well.   To learn more about what you can do with MyChart, go to [email or web address].    Your next appointment:   1 year(s)  Provider:   Tonny Bollman, MD  or Tereso Newcomer, PA-C         Other Instructions

## 2022-11-23 NOTE — Assessment & Plan Note (Signed)
Well controlled  -Continue benazepril 40mg  twice daily, Hydrochlorothiazide 25mg  daily.

## 2022-11-23 NOTE — Assessment & Plan Note (Signed)
LDL slightly above goal at 74 in July 2024. -Continue Lipitor 40mg  daily.  -Recheck lipids in six months.   -If LDL remains above 55, consider increasing atorvastatin or adding ezetimibe.

## 2022-11-30 DIAGNOSIS — M25561 Pain in right knee: Secondary | ICD-10-CM | POA: Diagnosis not present

## 2022-11-30 DIAGNOSIS — M25562 Pain in left knee: Secondary | ICD-10-CM | POA: Diagnosis not present

## 2022-11-30 DIAGNOSIS — M25512 Pain in left shoulder: Secondary | ICD-10-CM | POA: Diagnosis not present

## 2022-11-30 DIAGNOSIS — M25511 Pain in right shoulder: Secondary | ICD-10-CM | POA: Diagnosis not present

## 2022-12-09 ENCOUNTER — Ambulatory Visit (HOSPITAL_COMMUNITY): Payer: Medicare PPO | Attending: Internal Medicine

## 2022-12-09 DIAGNOSIS — I2510 Atherosclerotic heart disease of native coronary artery without angina pectoris: Secondary | ICD-10-CM | POA: Insufficient documentation

## 2022-12-09 DIAGNOSIS — R011 Cardiac murmur, unspecified: Secondary | ICD-10-CM | POA: Insufficient documentation

## 2022-12-09 DIAGNOSIS — E785 Hyperlipidemia, unspecified: Secondary | ICD-10-CM | POA: Diagnosis not present

## 2022-12-09 LAB — ECHOCARDIOGRAM COMPLETE
Area-P 1/2: 2.76 cm2
P 1/2 time: 401 ms
S' Lateral: 2.8 cm

## 2022-12-10 NOTE — Addendum Note (Signed)
Addended byAlben Spittle, Lorin Picket T on: 12/10/2022 08:06 AM   Modules accepted: Orders

## 2023-02-11 ENCOUNTER — Other Ambulatory Visit: Payer: Self-pay | Admitting: Medical Genetics

## 2023-02-11 DIAGNOSIS — Z006 Encounter for examination for normal comparison and control in clinical research program: Secondary | ICD-10-CM

## 2023-03-10 DIAGNOSIS — M1711 Unilateral primary osteoarthritis, right knee: Secondary | ICD-10-CM | POA: Diagnosis not present

## 2023-03-10 DIAGNOSIS — M25562 Pain in left knee: Secondary | ICD-10-CM | POA: Diagnosis not present

## 2023-03-10 DIAGNOSIS — M25561 Pain in right knee: Secondary | ICD-10-CM | POA: Diagnosis not present

## 2023-03-11 ENCOUNTER — Telehealth: Payer: Self-pay

## 2023-03-11 NOTE — Telephone Encounter (Signed)
   Pre-operative Risk Assessment    Patient Name: Randy [name]  DOB: 03/31/1946 MRN: [id number]      Request for Surgical Clearance    Procedure:   RIGHT TOTAL KNEE ARTHOPLASTY  Date of Surgery:  Clearance TBD                                 Surgeon:  DR. Malon Kindle Surgeon's Group or Practice Name:  [facility] Phone number:  [phone] Fax number:  [phone]   Type of Clearance Requested:   - Medical  - Pharmacy:  Hold Aspirin NEED INSTRUCTIONS W HEN TO HOLD   Type of Anesthesia:  Spinal   Additional requests/questions:    SignedMichaelle Copas   03/11/2023, 5:05 PM

## 2023-03-14 ENCOUNTER — Telehealth: Payer: Self-pay | Admitting: *Deleted

## 2023-03-14 NOTE — Telephone Encounter (Signed)
Pt has been scheduled tele pre op appt 03/28/23. Med rec and consent are done. Pt said surgery is trying to be planned for early jan 2025.

## 2023-03-14 NOTE — Telephone Encounter (Signed)
   Name: MILIK FABREGAS  DOB: 07/07/1945  MRN: [id number]  Primary Cardiologist: Tonny Bollman, MD   Preoperative team, please contact this patient and set up a phone call appointment for further preoperative risk assessment. Please obtain consent and complete medication review. Thank you for your help.  I confirm that guidance regarding antiplatelet and oral anticoagulation therapy has been completed and, if necessary, noted below.  Patient may hold aspirin 81 mg daily for 7 days prior to procedure, please resume when safe to do so from a bleeding standpoint.   I also confirmed the patient resides in the state of [place]. As per [facility] Medical Board telemedicine laws, the patient must reside in the state in which the provider is licensed.   Rip Harbour, NP 03/14/2023, 12:03 PM [facility]

## 2023-03-14 NOTE — Telephone Encounter (Signed)
Pt has been scheduled tele pre op appt 03/28/23. Med rec and consent are done. Pt said surgery is trying to be planned for early jan 2025.     Patient Consent for Virtual Visit        Randy [name] has provided verbal consent on 03/14/2023 for a virtual visit (video or telephone).   CONSENT FOR VIRTUAL VISIT FOR:  Randy [name]  By participating in this virtual visit I agree to the following:  I hereby voluntarily request, consent and authorize [facility] and its employed or contracted physicians, physician assistants, nurse practitioners or other licensed health care professionals (the Practitioner), to provide me with telemedicine health care services (the "Services") as deemed necessary by the treating Practitioner. I acknowledge and consent to receive the Services by the Practitioner via telemedicine. I understand that the telemedicine visit will involve communicating with the Practitioner through live audiovisual communication technology and the disclosure of certain medical information by electronic transmission. I acknowledge that I have been given the opportunity to request an in-person assessment or other available alternative prior to the telemedicine visit and am voluntarily participating in the telemedicine visit.  I understand that I have the right to withhold or withdraw my consent to the use of telemedicine in the course of my care at any time, without affecting my right to future care or treatment, and that the Practitioner or I may terminate the telemedicine visit at any time. I understand that I have the right to inspect all information obtained and/or recorded in the course of the telemedicine visit and may receive copies of available information for a reasonable fee.  I understand that some of the potential risks of receiving the Services via telemedicine include:  Delay or interruption in medical evaluation due to technological equipment failure or  disruption; Information transmitted may not be sufficient (e.g. poor resolution of images) to allow for appropriate medical decision making by the Practitioner; and/or  In rare instances, security protocols could fail, causing a breach of personal health information.  Furthermore, I acknowledge that it is my responsibility to provide information about my medical history, conditions and care that is complete and accurate to the best of my ability. I acknowledge that Practitioner's advice, recommendations, and/or decision may be based on factors not within their control, such as incomplete or inaccurate data provided by me or distortions of diagnostic images or specimens that may result from electronic transmissions. I understand that the practice of medicine is not an exact science and that Practitioner makes no warranties or guarantees regarding treatment outcomes. I acknowledge that a copy of this consent can be made available to me via my patient portal [facility] MyChart), or I can request a printed copy by calling the office of [facility].    I understand that my insurance will be billed for this visit.   I have read or had this consent read to me. I understand the contents of this consent, which adequately explains the benefits and risks of the Services being provided via telemedicine.  I have been provided ample opportunity to ask questions regarding this consent and the Services and have had my questions answered to my satisfaction. I give my informed consent for the services to be provided through the use of telemedicine in my medical care

## 2023-03-15 DIAGNOSIS — X32XXXD Exposure to sunlight, subsequent encounter: Secondary | ICD-10-CM | POA: Diagnosis not present

## 2023-03-15 DIAGNOSIS — Z1283 Encounter for screening for malignant neoplasm of skin: Secondary | ICD-10-CM | POA: Diagnosis not present

## 2023-03-15 DIAGNOSIS — L570 Actinic keratosis: Secondary | ICD-10-CM | POA: Diagnosis not present

## 2023-03-15 DIAGNOSIS — D225 Melanocytic nevi of trunk: Secondary | ICD-10-CM | POA: Diagnosis not present

## 2023-03-25 DIAGNOSIS — Z01818 Encounter for other preprocedural examination: Secondary | ICD-10-CM | POA: Diagnosis not present

## 2023-03-25 DIAGNOSIS — G4700 Insomnia, unspecified: Secondary | ICD-10-CM | POA: Diagnosis not present

## 2023-03-28 ENCOUNTER — Ambulatory Visit: Payer: Medicare PPO | Attending: Cardiology | Admitting: Emergency Medicine

## 2023-03-28 DIAGNOSIS — Z01810 Encounter for preprocedural cardiovascular examination: Secondary | ICD-10-CM | POA: Diagnosis not present

## 2023-03-28 NOTE — Progress Notes (Addendum)
Virtual Visit via Telephone Note   Because of Randy [name]'s co-morbid illnesses, he is at least at moderate risk for complications without adequate follow up.  This format is felt to be most appropriate for this patient at this time.  The patient did not have access to video technology/had technical difficulties with video requiring transitioning to audio format only (telephone).  All issues noted in this document were discussed and addressed.  No physical exam could be performed with this format.  Please refer to the patient's chart for his consent to telehealth for [facility].  Evaluation Performed:  Preoperative cardiovascular risk assessment _____________   Date:  03/28/2023   Patient ID:  Randy [name], DOB Jun 01, 1945, MRN [id number] Patient Location:  Home Provider location:   Office  Primary Care Provider:  Juluis Rainier, MD (Inactive) Primary Cardiologist:  Tonny Bollman, MD  Chief Complaint / Patient Profile   77 y.o. y/o male with a h/o CAD, HTN, HLD who is pending right knee arthoplasty by Dr. Malon Kindle [facility] and presents today for telephonic preoperative cardiovascular risk assessment.  History of Present Illness    Randy [name] is a 77 y.o. male who presents via audio/video conferencing for a telehealth visit today.  Pt was last seen in cardiology clinic on 11/23/2022 by Tereso Newcomer, PA.  At that time Randy [name] was doing well.  The patient is now pending procedure as outlined above. Since his last visit, he denies chest pain, shortness of breath, lower extremity edema, fatigue, palpitations, diaphoresis, weakness, presyncope, syncope, orthopnea, and PND.  Past Medical History    Past Medical History:  Diagnosis Date   Anemia    Anxiety    Arthritis    "hands, knees, shoulders" (06/24/2014)   CAD (coronary artery disease)    a. 11/2011 Cath: LM 20 ost, LAD 40 ost, 30-40p, 60-57m, D1 mild plaquing, LCX 70-80 p-m (FFR 0.78,  treated with  3.0x20 and 2.75x16 Promus DES), RCA 30-84m, EF 55%, no rwma.;  b.  ETT-Myoview (06/2013):  Ex 7:00, no ST changes, soft tissue atten, no scar or ischemia, EF 62%;  06/2014 MV: EF 56%, no isch/infarct.   H/O echocardiogram    a. 06/2014 Echo: EF 55-60%, Gr 1 DD.   Hyperlipidemia    Hypertension    Past Surgical History:  Procedure Laterality Date   COLONOSCOPY WITH PROPOFOL N/A 02/20/2013   Procedure: COLONOSCOPY WITH PROPOFOL;  Surgeon: Petra Kuba, MD;  Location: [facility] ENDOSCOPY;  Service: Endoscopy;  Laterality: N/A;   CORONARY ANGIOPLASTY WITH STENT PLACEMENT  11/2011   "circumflex"   HOT HEMOSTASIS N/A 02/20/2013   Procedure: HOT HEMOSTASIS (ARGON PLASMA COAGULATION/BICAP);  Surgeon: Petra Kuba, MD;  Location: [facility];  Service: Endoscopy;  Laterality: N/A;   LEFT HEART CATHETERIZATION WITH CORONARY ANGIOGRAM N/A 12/02/2011   Procedure: LEFT HEART CATHETERIZATION WITH CORONARY ANGIOGRAM;  Surgeon: Dolores Patty, MD;  Location: [facility] CATH LAB;  Service: Cardiovascular;  Laterality: N/A;   WISDOM TOOTH EXTRACTION      Allergies  Allergies  Allergen Reactions   Codeine Other (See Comments)    Unknown     Home Medications    Prior to Admission medications   Medication Sig Start Date End Date Taking? Authorizing Provider  aspirin EC 81 MG tablet Take 81 mg by mouth daily. 12/03/11   Creig Hines, NP  atorvastatin (LIPITOR) 40 MG tablet Take 1 tablet (40 mg total) by mouth daily at 6 PM.  08/16/18   Tonny Bollman, MD  B Complex-C (B-COMPLEX WITH VITAMIN C) tablet Take 1 tablet by mouth daily. AS DIRECTED    [provider]  benazepril (LOTENSIN) 40 MG tablet Take 40 mg by mouth 2 (two) times daily.    [provider]  CALCIUM PO Take 1 tablet by mouth daily.    [provider]  cholecalciferol (VITAMIN D3) 25 MCG (1000 UNIT) tablet Take 1,000 Units by mouth daily.    [provider]  Coenzyme Q10 (CO Q 10 PO) Take 10  mLs by mouth daily.    [provider]  diphenhydrAMINE (BENADRYL) 25 mg capsule Take 25 mg by mouth every 6 (six) hours as needed.    [provider]  hydrochlorothiazide (HYDRODIURIL) 25 MG tablet Take 1 tablet by mouth every morning.    [provider]  loratadine (CLARITIN) 10 MG tablet Take 10 mg by mouth daily.    [provider]  Melatonin 3-10 MG TABS Take by mouth. AS DIRECTED    [provider]  methocarbamol (ROBAXIN) 500 MG tablet Take 1 tablet by mouth every 8 (eight) hours. 11/30/22   [provider]  metoprolol succinate (TOPROL-XL) 50 MG 24 hr tablet Take 1 tablet (50 mg total) by mouth daily. 02/05/15   Tonny Bollman, MD  Multiple Vitamin (MULTIVITAMIN WITH MINERALS) TABS Take 1 tablet by mouth every morning.    [provider]  nitroGLYCERIN (NITROSTAT) 0.4 MG SL tablet Place 0.4 mg under the tongue every 5 (five) minutes as needed for chest pain. Patient not taking: Reported on 03/14/2023    [provider]  zolpidem (AMBIEN) 10 MG tablet Take 10 mg by mouth at bedtime as needed for sleep.  12/16/11   [provider]    Physical Exam    Vital Signs:  SEYMOUR BENNETTS does not have vital signs available for review today.  Given telephonic nature of communication, physical exam is limited. AAOx3. NAD. Normal affect.  Speech and respirations are unlabored.  Accessory Clinical Findings    None  Assessment & Plan    1.  Preoperative Cardiovascular Risk Assessment: According to the Revised Cardiac Risk Index (RCRI), his Perioperative Risk of Major Cardiac Event is (%): 6.6. His Functional Capacity in METs is: 7.34 according to the [facility] Activity Status Index (DASI). Therefore, based on ACC/AHA guidelines, patient would be at acceptable risk for the planned procedure without further cardiovascular testing. I will route this recommendation to the requesting party via Epic fax function.   The patient  was advised that if he develops new symptoms prior to surgery to contact our office to arrange for a follow-up visit, and he verbalized understanding.  He may hold Aspirin for 7 days prior to procedure. Please resume Aspirin as soon as possible postprocedure, at the discretion of the surgeon.   A copy of this note will be routed to requesting surgeon.  Time:   Today, I have spent 8 minutes with the patient with telehealth technology discussing medical history, symptoms, and management plan.     Denyce Robert, NP  03/28/2023, 10:09 AM

## 2023-05-05 NOTE — H&P (Signed)
Patient's anticipated LOS is less than 2 midnights, meeting these requirements: - Younger than 78 - Lives within 1 hour of care - Has a competent adult at home to recover with post-op recover - NO history of  - Chronic pain requiring opiods  - Diabetes  - Coronary Artery Disease  - Heart failure  - Heart attack  - Stroke  - DVT/VTE  - Cardiac arrhythmia  - Respiratory Failure/COPD  - Renal failure  - Anemia  - Advanced Liver disease     Randy [name] is an 78 y.o. male.    Chief Complaint: right knee pain  HPI: Pt is a 78 y.o. male complaining of right knee pain for multiple years. Pain had continually increased since the beginning. X-rays in the clinic show end-stage arthritic changes of the right knee. Pt has tried various conservative treatments which have failed to alleviate their symptoms, including injections and therapy. Various options are discussed with the patient. Risks, benefits and expectations were discussed with the patient. Patient understand the risks, benefits and expectations and wishes to proceed with surgery.   PCP:  Juluis Rainier, MD (Inactive)  D/C Plans: Home  PMH: Past Medical History:  Diagnosis Date   Anemia    Anxiety    Arthritis    "hands, knees, shoulders" (06/24/2014)   CAD (coronary artery disease)    a. 11/2011 Cath: LM 20 ost, LAD 40 ost, 30-40p, 60-58m, D1 mild plaquing, LCX 70-80 p-m (FFR 0.78, treated with  3.0x20 and 2.75x16 Promus DES), RCA 30-16m, EF 55%, no rwma.;  b.  ETT-Myoview (06/2013):  Ex 7:00, no ST changes, soft tissue atten, no scar or ischemia, EF 62%;  06/2014 MV: EF 56%, no isch/infarct.   H/O echocardiogram    a. 06/2014 Echo: EF 55-60%, Gr 1 DD.   Hyperlipidemia    Hypertension     PSH: Past Surgical History:  Procedure Laterality Date   COLONOSCOPY WITH PROPOFOL N/A 02/20/2013   Procedure: COLONOSCOPY WITH PROPOFOL;  Surgeon: Petra Kuba, MD;  Location: [facility];  Service: Endoscopy;  Laterality: N/A;    CORONARY ANGIOPLASTY WITH STENT PLACEMENT  11/2011   "circumflex"   HOT HEMOSTASIS N/A 02/20/2013   Procedure: HOT HEMOSTASIS (ARGON PLASMA COAGULATION/BICAP);  Surgeon: Petra Kuba, MD;  Location: [facility];  Service: Endoscopy;  Laterality: N/A;   LEFT HEART CATHETERIZATION WITH CORONARY ANGIOGRAM N/A 12/02/2011   Procedure: LEFT HEART CATHETERIZATION WITH CORONARY ANGIOGRAM;  Surgeon: Dolores Patty, MD;  Location: [facility] CATH LAB;  Service: Cardiovascular;  Laterality: N/A;   WISDOM TOOTH EXTRACTION      Social History:  reports that he has never smoked. He has quit using smokeless tobacco.  His smokeless tobacco use included chew. He reports current alcohol use. He reports that he does not use drugs. BMI: Estimated body mass index is 29.86 kg/m as calculated from the following:   Height as of 11/23/22: 5\' 6"  (1.676 m).   Weight as of 11/23/22: 83.9 kg.  Lab Results  Component Value Date   ALBUMIN 3.8 08/21/2012   Diabetes: Patient does not have a diagnosis of diabetes.     Smoking Status:   reports that he has never smoked. He has quit using smokeless tobacco.  His smokeless tobacco use included chew.    Allergies:  Allergies  Allergen Reactions   Codeine Other (See Comments)    Unknown     Medications: No current facility-administered medications for this encounter.   Current Outpatient Medications  Medication  Sig Dispense Refill   aspirin EC 81 MG tablet Take 81 mg by mouth daily.     atorvastatin (LIPITOR) 40 MG tablet Take 1 tablet (40 mg total) by mouth daily at 6 PM. 90 tablet 0   B Complex-C (B-COMPLEX WITH VITAMIN C) tablet Take 1 tablet by mouth daily. AS DIRECTED     benazepril (LOTENSIN) 40 MG tablet Take 40 mg by mouth 2 (two) times daily.     CALCIUM PO Take 1 tablet by mouth daily.     cholecalciferol (VITAMIN D3) 25 MCG (1000 UNIT) tablet Take 1,000 Units by mouth daily.     Coenzyme Q10 (CO Q 10 PO) Take 10 mLs by mouth daily.     diphenhydrAMINE  (BENADRYL) 25 mg capsule Take 25 mg by mouth every 6 (six) hours as needed.     hydrochlorothiazide (HYDRODIURIL) 25 MG tablet Take 1 tablet by mouth every morning.     loratadine (CLARITIN) 10 MG tablet Take 10 mg by mouth daily.     Melatonin 3-10 MG TABS Take by mouth. AS DIRECTED     methocarbamol (ROBAXIN) 500 MG tablet Take 1 tablet by mouth every 8 (eight) hours.     metoprolol succinate (TOPROL-XL) 50 MG 24 hr tablet Take 1 tablet (50 mg total) by mouth daily. 90 tablet 3   Multiple Vitamin (MULTIVITAMIN WITH MINERALS) TABS Take 1 tablet by mouth every morning.     nitroGLYCERIN (NITROSTAT) 0.4 MG SL tablet Place 0.4 mg under the tongue every 5 (five) minutes as needed for chest pain. (Patient not taking: Reported on 03/14/2023)     zolpidem (AMBIEN) 10 MG tablet Take 10 mg by mouth at bedtime as needed for sleep.       No results found for this or any previous visit (from the past 48 hours). No results found.  ROS: Pain with rom of the right lower extremity  Physical Exam: Alert and oriented 78 y.o. male in no acute distress Cranial nerves 2-12 intact Cervical spine: full rom with no tenderness, nv intact distally Chest: active breath sounds bilaterally, no wheeze rhonchi or rales Heart: regular rate and rhythm, no murmur Abd: non tender non distended with active bowel sounds Hip is stable with rom  Right knee painful rom with crepitus Nv intact distally No rashes or edema distally  Assessment/Plan Assessment: right knee end stage osteoarthritis  Plan:  Patient will undergo a right total knee by Dr. Ranell Patrick at [place] Risks benefits and expectations were discussed with the patient. Patient understand risks, benefits and expectations and wishes to proceed. Preoperative templating of the joint replacement has been completed, documented, and submitted to the Operating Room personnel in order to optimize intra-operative equipment management.   Alphonsa Overall PA-C, MPAS [facility] is now [employer] [address], [place], [place] [address] Phone: [phone] [email or web address] Facebook  [employer]

## 2023-05-11 DIAGNOSIS — I25119 Atherosclerotic heart disease of native coronary artery with unspecified angina pectoris: Secondary | ICD-10-CM | POA: Diagnosis not present

## 2023-05-11 DIAGNOSIS — Z6830 Body mass index (BMI) 30.0-30.9, adult: Secondary | ICD-10-CM | POA: Diagnosis not present

## 2023-05-11 DIAGNOSIS — E7800 Pure hypercholesterolemia, unspecified: Secondary | ICD-10-CM | POA: Diagnosis not present

## 2023-05-11 DIAGNOSIS — Z0000 Encounter for general adult medical examination without abnormal findings: Secondary | ICD-10-CM | POA: Diagnosis not present

## 2023-05-11 DIAGNOSIS — Z125 Encounter for screening for malignant neoplasm of prostate: Secondary | ICD-10-CM | POA: Diagnosis not present

## 2023-05-11 DIAGNOSIS — G4700 Insomnia, unspecified: Secondary | ICD-10-CM | POA: Diagnosis not present

## 2023-05-11 DIAGNOSIS — I10 Essential (primary) hypertension: Secondary | ICD-10-CM | POA: Diagnosis not present

## 2023-05-14 NOTE — Progress Notes (Addendum)
 COVID Vaccine received:  []  No [x]  Yes Date of any COVID positive Test in last 90 days:  None  PCP - [name], DO at [name] medical clearance in [place].   [phone] Cardiologist - Arnoldo Lapping, MD , [name], NP cardiac clearance in 03-28-23 note and in [place]  Chest x-ray - 06-01-2013  1v  Epic EKG -  11-23-2022   Epic Stress Test - 11-23-2017  Epic ECHO - 12-09-2022  Epic Cardiac Cath - 12-02-2011  LHC -DESx1  Dr. [name]  PCR screen: [x]  Ordered & Completed []   No Order but Needs PROFEND     []   N/A for this surgery  Surgery Plan:  []  Ambulatory   [x]  Outpatient in bed  []  Admit Anesthesia:    []  General  [x]  Spinal  []   Choice []   MAC  Pacemaker / ICD device [x]  No []  Yes   Spinal Cord Stimulator:[x]  No []  Yes       History of Sleep Apnea? [x]  No []  Yes   CPAP used?- [x]  No []  Yes    Does the patient monitor blood sugar?   []  N/A   [x]  No []  Yes  Patient has: []  NO Hx DM   [x]  Pre-DM   []  DM1  []   DM2 Last A1c was: 6.0 on    03-25-23   Blood Thinner / Instructions: none Aspirin  Instructions:  ASA 81 mg   Hold 5-7 days   Okay w/ cardiology. Patient has already stopped ASA.   ERAS Protocol Ordered: []  No  [x]  Yes PRE-SURGERY []  ENSURE  [x]  G2   Patient is to be NPO after: 06:00  Dental hx: []  Dentures:  [x]  N/A      []  Bridge or Partial:                   []  Loose or Damaged teeth:   Comments: Patient was given the 5 CHG shower / bath instructions for TKA surgery along with 2 bottles of the CHG soap. Patient will start this on: 05-23-2023 All questions were asked and answered, Patient voiced understanding of this process.   Activity level: Patient is able climb a flight of stairs without difficulty; [x]  No CP  [x]  No SOB, but would have leg pain. Patient can perform ADLs without assistance.   Anesthesia review: CAD- LHC-DESx1, Murmur (ECHO 12-09-22), HTN, Pre-DM   Patient is due to have a colonoscopy this Friday,05-20-23. He was wanting to know if  this is OK. I told him to contact Dr. [name]' office.   Patient denies shortness of breath, fever, cough and chest pain at PAT appointment.  Patient verbalized understanding and agreement to the Pre-Surgical Instructions that were given to them at this PAT appointment. Patient was also educated of the need to review these PAT instructions again prior to his surgery.I reviewed the appropriate phone numbers to call if they have any and questions or concerns.

## 2023-05-14 NOTE — Patient Instructions (Addendum)
 SURGICAL WAITING ROOM VISITATION Patients having surgery or a procedure may have no more than 2 support people in the waiting area - these visitors may rotate in the visitor waiting room.   Due to an increase in RSV and influenza rates and associated hospitalizations, children ages 66 and under may not visit patients in [facility] hospitals. If the patient needs to stay at the hospital during part of their recovery, the visitor guidelines for inpatient rooms apply.  PRE-OP VISITATION  Pre-op nurse will coordinate an appropriate time for 1 support person to accompany the patient in pre-op.  This support person may not rotate.  This visitor will be contacted when the time is appropriate for the visitor to come back in the pre-op area.  Please refer to the [facility] website for the visitor guidelines for Inpatients (after your surgery is over and you are in a regular room).  You are not required to quarantine at this time prior to your surgery. However, you must do this: Hand Hygiene often Do NOT share personal items Notify your provider if you are in close contact with someone who has COVID or you develop fever 100.4 or greater, new onset of sneezing, cough, sore throat, shortness of breath or body aches.  If you test positive for Covid or have been in contact with anyone that has tested positive in the last 10 days please notify you surgeon.    Your procedure is scheduled on:  FRIDAY  May 27, 2023  Report to [facility] Main Entrance: [address] entrance where the [employer] is available.   Report to admitting at: 06:30    AM  Call this number if you have any questions or problems the morning of surgery [phone]  Do not eat food after Midnight the night prior to your surgery/procedure.  After Midnight you may have the following liquids until 6:00 AM DAY OF SURGERY  Clear Liquid Diet Water  Black Coffee (sugar ok, NO MILK/CREAM OR CREAMERS)  Tea (sugar ok, NO  MILK/CREAM OR CREAMERS) regular and decaf                             Plain Jell-O  with no fruit (NO RED)                                           Fruit ices (not with fruit pulp, NO RED)                                     Popsicles (NO RED)                                                                  Juice: NO CITRUS JUICES: only apple, WHITE grape, WHITE cranberry Sports drinks like Gatorade or Powerade (NO RED)                   The day of surgery:  Drink ONE (1) Pre-Surgery G2 at   06:00 AM the morning of surgery. Drink in  one sitting. Do not sip.  This drink was given to you during your hospital pre-op appointment visit. Nothing else to drink after completing the Pre-Surgery  G2 : No candy, chewing gum or throat lozenges.    FOLLOW ANY ADDITIONAL PRE OP INSTRUCTIONS YOU RECEIVED FROM YOUR SURGEON'S OFFICE!!!   Oral Hygiene is also important to reduce your risk of infection.        Remember - BRUSH YOUR TEETH THE MORNING OF SURGERY WITH YOUR REGULAR TOOTHPASTE  Do NOT smoke after Midnight the night before surgery.  STOP TAKING all Vitamins, Herbs and supplements 1 week before your surgery.   Take ONLY these medicines the morning of surgery with A SIP OF WATER : Tylenol  if needed for pain.                      You may not have any metal on your body including  jewelry, and body piercing  Do not wear  lotions, powders, cologne, or deodorant  Men may shave face and neck.  Contacts, Hearing Aids, dentures or bridgework may not be worn into surgery. DENTURES WILL BE REMOVED PRIOR TO SURGERY PLEASE DO NOT APPLY "Poly grip" OR ADHESIVES!!!  You may bring a small overnight bag with you on the day of surgery, only pack items that are not valuable. [facility] IS NOT RESPONSIBLE   FOR VALUABLES THAT ARE LOST OR STOLEN.   Do not bring your home medications to the hospital. The Pharmacy will dispense medications listed on your medication list to you during your admission in the  Hospital.  Special Instructions: Bring a copy of your healthcare power of attorney and living will documents the day of surgery, if you wish to have them scanned into your [facility] Records- EPIC  Please read over the following fact sheets you were given: IF YOU HAVE QUESTIONS ABOUT YOUR PRE-OP INSTRUCTIONS, PLEASE CALL [phone].     Pre-operative 5 CHG Bath Instructions   You can play a key role in reducing the risk of infection after surgery. Your skin needs to be as free of germs as possible. You can reduce the number of germs on your skin by washing with CHG (chlorhexidine  gluconate) soap before surgery. CHG is an antiseptic soap that kills germs and continues to kill germs even after washing.   DO NOT use if you have an allergy to chlorhexidine /CHG or antibacterial soaps. If your skin becomes reddened or irritated, stop using the CHG and notify one of our RNs at [phone]  Please shower with the CHG soap starting 4 days before surgery using the following schedule: START SHOWERS ON MONDAY  May 23, 2023  Please keep in mind the following:  DO NOT shave, including legs and underarms, starting the day of your first shower.   You may shave your face at any point before/day of surgery.   Place clean sheets on your bed the day you start using CHG soap. Use a clean washcloth (not used since being washed) for each shower. DO NOT sleep with pets once you start using the CHG.   CHG Shower Instructions:  If you choose to wash your hair and private area, wash first with your normal shampoo/soap.  After you use shampoo/soap, rinse your hair and body thoroughly to remove shampoo/soap residue.  Turn the water  OFF and apply about 3 tablespoons (45 ml) of CHG soap to a CLEAN washcloth.  Apply CHG soap ONLY  FROM YOUR NECK DOWN TO YOUR TOES (washing for 3-5 minutes)  DO NOT use CHG soap on face, private areas, open wounds, or sores.  Pay special attention to the area where your surgery is being performed.  If you are having back surgery, having someone wash your back for you may be helpful.  Wait 2 minutes after CHG soap is applied, then you may rinse off the CHG soap.  Pat dry with a clean towel  Put on clean clothes/pajamas   If you choose to wear lotion, please use ONLY the CHG-compatible lotions on the back of this paper.     Additional instructions for the day of surgery: DO NOT APPLY any lotions, deodorants, cologne, or perfumes.   Put on clean/comfortable clothes.  Brush your teeth.  Ask your nurse before applying any prescription medications to the skin.      CHG Compatible Lotions   Aveeno Moisturizing lotion  Cetaphil Moisturizing Cream  Cetaphil Moisturizing Lotion  Clairol Herbal Essence Moisturizing Lotion, Dry Skin  Clairol Herbal Essence Moisturizing Lotion, Extra Dry Skin  Clairol Herbal Essence Moisturizing Lotion, Normal Skin  Curel Age Defying Therapeutic Moisturizing Lotion with Alpha Hydroxy  Curel Extreme Care Body Lotion  Curel Soothing Hands Moisturizing Hand Lotion  Curel Therapeutic Moisturizing Cream, Fragrance-Free  Curel Therapeutic Moisturizing Lotion, Fragrance-Free  Curel Therapeutic Moisturizing Lotion, Original Formula  Eucerin Daily Replenishing Lotion  Eucerin Dry Skin Therapy Plus Alpha Hydroxy Crme  Eucerin Dry Skin Therapy Plus Alpha Hydroxy Lotion  Eucerin Original Crme  Eucerin Original Lotion  Eucerin Plus Crme Eucerin Plus Lotion  Eucerin TriLipid Replenishing Lotion  Keri Anti-Bacterial Hand Lotion  Keri Deep Conditioning Original Lotion Dry Skin Formula Softly Scented  Keri Deep Conditioning Original Lotion, Fragrance Free Sensitive Skin Formula  Keri Lotion Fast Absorbing Fragrance Free Sensitive Skin Formula  Keri Lotion Fast  Absorbing Softly Scented Dry Skin Formula  Keri Original Lotion  Keri Skin Renewal Lotion Keri Silky Smooth Lotion  Keri Silky Smooth Sensitive Skin Lotion  Nivea Body Creamy Conditioning Oil  Nivea Body Extra Enriched Lotion  Nivea Body Original Lotion  Nivea Body Sheer Moisturizing Lotion Nivea Crme  Nivea Skin Firming Lotion  NutraDerm 30 Skin Lotion  NutraDerm Skin Lotion  NutraDerm Therapeutic Skin Cream  NutraDerm Therapeutic Skin Lotion  ProShield Protective Hand Cream  Provon moisturizing lotion   FAILURE TO FOLLOW THESE INSTRUCTIONS MAY RESULT IN THE CANCELLATION OF YOUR SURGERY  PATIENT SIGNATURE_________________________________  NURSE SIGNATURE__________________________________  ________________________________________________________________________       Benjamen Brand    An incentive spirometer is a tool that can help keep your lungs clear and active. This tool measures how well you are filling your lungs with each breath. Taking  long deep breaths may help reverse or decrease the chance of developing breathing (pulmonary) problems (especially infection) following: A long period of time when you are unable to move or be active. BEFORE THE PROCEDURE  If the spirometer includes an indicator to show your best effort, your nurse or respiratory therapist will set it to a desired goal. If possible, sit up straight or lean slightly forward. Try not to slouch. Hold the incentive spirometer in an upright position. INSTRUCTIONS FOR USE  Sit on the edge of your bed if possible, or sit up as far as you can in bed or on a chair. Hold the incentive spirometer in an upright position. Breathe out normally. Place the mouthpiece in your mouth and seal your lips tightly around it. Breathe in slowly and as deeply as possible, raising the piston or the ball toward the top of the column. Hold your breath for 3-5 seconds or for as long as possible. Allow the piston or ball  to fall to the bottom of the column. Remove the mouthpiece from your mouth and breathe out normally. Rest for a few seconds and repeat Steps 1 through 7 at least 10 times every 1-2 hours when you are awake. Take your time and take a few normal breaths between deep breaths. The spirometer may include an indicator to show your best effort. Use the indicator as a goal to work toward during each repetition. After each set of 10 deep breaths, practice coughing to be sure your lungs are clear. If you have an incision (the cut made at the time of surgery), support your incision when coughing by placing a pillow or rolled up towels firmly against it. Once you are able to get out of bed, walk around indoors and cough well. You may stop using the incentive spirometer when instructed by your caregiver.  RISKS AND COMPLICATIONS Take your time so you do not get dizzy or light-headed. If you are in pain, you may need to take or ask for pain medication before doing incentive spirometry. It is harder to take a deep breath if you are having pain. AFTER USE Rest and breathe slowly and easily. It can be helpful to keep track of a log of your progress. Your caregiver can provide you with a simple table to help with this. If you are using the spirometer at home, follow these instructions: SEEK MEDICAL CARE IF:  You are having difficultly using the spirometer. You have trouble using the spirometer as often as instructed. Your pain medication is not giving enough relief while using the spirometer. You develop fever of 100.5 F (38.1 C) or higher.                                                                                                    SEEK IMMEDIATE MEDICAL CARE IF:  You cough up bloody sputum that had not been present before. You develop fever of 102 F (38.9 C) or greater. You develop worsening pain at or near the incision site. MAKE SURE YOU:  Understand these instructions. Will watch your  condition.  Will get help right away if you are not doing well or get worse. Document Released: 08/02/2006 Document Revised: 06/14/2011 Document Reviewed: 10/03/2006 [facility] Patient Information 2014 [place], [place].     If you would like to see a video about joint replacement:   [email or web address]

## 2023-05-16 ENCOUNTER — Encounter (HOSPITAL_COMMUNITY)
Admission: RE | Admit: 2023-05-16 | Discharge: 2023-05-16 | Disposition: A | Discharge status: Home / Self Care | Payer: Medicare PPO | Source: Ambulatory Visit | Attending: Orthopedic Surgery

## 2023-05-16 ENCOUNTER — Other Ambulatory Visit: Payer: Self-pay

## 2023-05-16 ENCOUNTER — Encounter (HOSPITAL_COMMUNITY): Payer: Self-pay

## 2023-05-16 VITALS — BP 136/78 | HR 80 | Temp 98.2°F | Resp 16 | Ht 65.0 in | Wt 179.0 lb

## 2023-05-16 DIAGNOSIS — I2510 Atherosclerotic heart disease of native coronary artery without angina pectoris: Secondary | ICD-10-CM

## 2023-05-16 DIAGNOSIS — Z01812 Encounter for preprocedural laboratory examination: Secondary | ICD-10-CM | POA: Insufficient documentation

## 2023-05-16 DIAGNOSIS — I10 Essential (primary) hypertension: Secondary | ICD-10-CM

## 2023-05-16 DIAGNOSIS — Z01818 Encounter for other preprocedural examination: Secondary | ICD-10-CM

## 2023-05-16 HISTORY — DX: Malignant (primary) neoplasm, unspecified: C80.1

## 2023-05-16 HISTORY — DX: Cardiac murmur, unspecified: R01.1

## 2023-05-16 HISTORY — DX: Other complications of anesthesia, initial encounter: T88.59XA

## 2023-05-16 HISTORY — DX: Prediabetes: R73.03

## 2023-05-16 LAB — CBC
HCT: 38 % — ABNORMAL LOW (ref 39.0–52.0)
Hemoglobin: 13.2 g/dL (ref 13.0–17.0)
MCH: 32.3 pg (ref 26.0–34.0)
MCHC: 34.7 g/dL (ref 30.0–36.0)
MCV: 92.9 fL (ref 80.0–100.0)
Platelets: 199 10*3/uL (ref 150–400)
RBC: 4.09 MIL/uL — ABNORMAL LOW (ref 4.22–5.81)
RDW: 13 % (ref 11.5–15.5)
WBC: 5.6 10*3/uL (ref 4.0–10.5)
nRBC: 0 % (ref 0.0–0.2)

## 2023-05-16 LAB — BASIC METABOLIC PANEL
Anion gap: 11 (ref 5–15)
BUN: 15 mg/dL (ref 8–23)
CO2: 24 mmol/L (ref 22–32)
Calcium: 8.9 mg/dL (ref 8.9–10.3)
Chloride: 102 mmol/L (ref 98–111)
Creatinine, Ser: 1.06 mg/dL (ref 0.61–1.24)
GFR, Estimated: >60 mL/min (ref >60)
Glucose, Bld: 110 mg/dL — ABNORMAL HIGH (ref 70–99)
Potassium: 3.6 mmol/L (ref 3.5–5.1)
Sodium: 137 mmol/L (ref 135–145)

## 2023-05-16 LAB — SURGICAL PCR SCREEN
MRSA, PCR: NEGATIVE
Staphylococcus aureus: NEGATIVE

## 2023-05-19 NOTE — Progress Notes (Signed)
Case: [id number] Date/Time: 05/27/23 0845   Procedure: TOTAL KNEE ARTHROPLASTY (Right: Knee) - and general , put in flip room   Anesthesia type: Spinal   Pre-op diagnosis: Right knee osteoarthritis   Location[facility] ROOM 08 / [facility]   Surgeons: Beverely Low, MD       DISCUSSION: Randy [name] is a 78 yo male who presents to PAT prior to surgery above. PMH of HTN, CAD s/p PCI in 2013, heart murmur, arthritis, prediabetes, anxiety  Prior anesthesia complications include prolonged emergence.  Patient follows with Cardiology for CAD s/p PCI in 2013. Myoview negative for ischemia in 2015.  ETT with borderline changes but no symptoms in 2019. Echo in 12/2022 with normal EF 55-60%, grade I DD, no significant valvular abnormalities. Pt reported easy bruising and arthritis pain but no cardiac symptoms. Advised f/u in 1 year. Seen for cardiac clearance on 03/28/23 and cleared:  "Preoperative Cardiovascular Risk Assessment: According to the Revised Cardiac Risk Index (RCRI), his Perioperative Risk of Major Cardiac Event is (%): 6.6. His Functional Capacity in METs is: 7.34 according to the Duke Activity Status Index (DASI). Therefore, based on ACC/AHA guidelines, patient would be at acceptable risk for the planned procedure without further cardiovascular testing. I will route this recommendation to the requesting party via Epic fax function.    The patient was advised that if he develops new symptoms prior to surgery to contact our office to arrange for a follow-up visit, and he verbalized understanding.   He may hold Aspirin for 7 days prior to procedure. Please resume Aspirin as soon as possible postprocedure, at the discretion of the surgeon."  Patient follows with PCP at [facility]. Medical clearance signed (scanned in media tab on 04/05/23) that patient is cleared and low risk. A1c was 6.0 on 03/25/23.  VS: BP 136/78 Comment: right arm sitting  Pulse 80   Temp 36.8 C (Oral)   Resp 16   Ht 5\' 5"   (1.651 m)   Wt 81.2 kg   SpO2 98%   BMI 29.79 kg/m   PROVIDERS: Jackelyn Poling, DO Cardiology: Tonny Bollman, MD  LABS: Labs reviewed: Acceptable for surgery. (all labs ordered are listed, but only abnormal results are displayed)  Labs Reviewed  BASIC METABOLIC PANEL - Abnormal; Notable for the following components:      Result Value   Glucose, Bld 110 (*)    All other components within normal limits  CBC - Abnormal; Notable for the following components:   RBC 4.09 (*)    HCT 38.0 (*)    All other components within normal limits  SURGICAL PCR SCREEN     IMAGES:   EKG 11/23/22:  Normal sinus rhythm, rate 71 Low voltage QRS No significant change since Since last tracing  CV:  Echo 12/09/22:  IMPRESSIONS    1. Left ventricular ejection fraction, by estimation, is 55 to 60%. The left ventricle has normal function. The left ventricle has no regional wall motion abnormalities. Left ventricular diastolic parameters are consistent with Grade I diastolic dysfunction (impaired relaxation).  2. Right ventricular systolic function is normal. The right ventricular size is normal. There is normal pulmonary artery systolic pressure. The estimated right ventricular systolic pressure is 17.6 mmHg.  3. The mitral valve is normal in structure. Trivial mitral valve regurgitation. No evidence of mitral stenosis.  4. The aortic valve is tricuspid. There is mild calcification of the aortic valve. Aortic valve regurgitation is trivial. No aortic stenosis is present.  5. Aortic dilatation  noted. There is mild dilatation of the ascending aorta, measuring 39 mm.  6. The inferior vena cava is normal in size with greater than 50% respiratory variability, suggesting right atrial pressure of 3 mmHg.  Exercise Stress test 11/23/2017:  Blood pressure demonstrated a hypertensive response to exercise. Horizontal ST segment depression ST segment depression of 1 mm was noted during stress in  the II, III, aVF, V5 and V6 leads.   Positive ETT 1 mm horizontal ST segment depression in inferior lateral leads with stress HTN response to exercise Past Medical History:  Diagnosis Date   Anemia    Anxiety    Arthritis    "hands, knees, shoulders" (06/24/2014)   CAD (coronary artery disease)    a. 11/2011 Cath: LM 20 ost, LAD 40 ost, 30-40p, 60-62m, D1 mild plaquing, LCX 70-80 p-m (FFR 0.78, treated with  3.0x20 and 2.75x16 Promus DES), RCA 30-48m, EF 55%, no rwma.;  b.  ETT-Myoview (06/2013):  Ex 7:00, no ST changes, soft tissue atten, no scar or ischemia, EF 62%;  06/2014 MV: EF 56%, no isch/infarct.   Cancer (HCC)    basal cell lesion on forehead   Complication of anesthesia    Takes a long time to wake up   H/O echocardiogram    a. 06/2014 Echo: EF 55-60%, Gr 1 DD.   Heart murmur    ECHO done 12-09-2022   Hyperlipidemia    Hypertension    Pre-diabetes     Past Surgical History:  Procedure Laterality Date   COLONOSCOPY WITH PROPOFOL N/A 02/20/2013   Procedure: COLONOSCOPY WITH PROPOFOL;  Surgeon: Petra Kuba, MD;  [facility];  Service: Endoscopy;  Laterality: N/A;   CORONARY ANGIOPLASTY WITH STENT PLACEMENT  11/04/2011   "circumflex"   EYE SURGERY Bilateral 2015   cataract extraction   HOT HEMOSTASIS N/A 02/20/2013   Procedure: HOT HEMOSTASIS (ARGON PLASMA COAGULATION/BICAP);  Surgeon: Petra Kuba, MD;  Location: [facility];  Service: Endoscopy;  Laterality: N/A;   LEFT HEART CATHETERIZATION WITH CORONARY ANGIOGRAM N/A 12/02/2011   Procedure: LEFT HEART CATHETERIZATION WITH CORONARY ANGIOGRAM;  Surgeon: Dolores Patty, MD;  Location: [facility] CATH LAB;  Service: Cardiovascular;  Laterality: N/A;   WISDOM TOOTH EXTRACTION      MEDICATIONS:  acetaminophen (TYLENOL) 650 MG CR tablet   aspirin EC 81 MG tablet   atorvastatin (LIPITOR) 40 MG tablet   benazepril (LOTENSIN) 40 MG tablet   CALCIUM CITRATE PO   Coenzyme Q10 (CO Q 10 PO)   diphenhydrAMINE (BENADRYL)  25 mg capsule   hydrochlorothiazide (HYDRODIURIL) 25 MG tablet   loratadine (CLARITIN) 10 MG tablet   melatonin 5 MG TABS   methocarbamol (ROBAXIN) 500 MG tablet   metoprolol succinate (TOPROL-XL) 50 MG 24 hr tablet   Multiple Vitamin (MULTIVITAMIN WITH MINERALS) TABS   nitroGLYCERIN (NITROSTAT) 0.4 MG SL tablet   OVER THE COUNTER MEDICATION   zolpidem (AMBIEN) 10 MG tablet   No current facility-administered medications for this encounter.   Marcille Blanco [facility] Short Stay/Anesthesiology [facility] Phone [phone] 05/19/2023 1:34 PM

## 2023-05-19 NOTE — Anesthesia Preprocedure Evaluation (Addendum)
 Anesthesia Evaluation  Patient identified by MRN, date of birth, ID band Patient awake    Reviewed: Allergy & Precautions, H&P , NPO status , Patient's Chart, lab work & pertinent test results  History of Anesthesia Complications (+) history of anesthetic complications  Airway Mallampati: II  TM Distance: >3 FB Neck ROM: Full    Dental no notable dental hx. (+) Poor Dentition, Chipped, Missing, Dental Advisory Given   Pulmonary neg pulmonary ROS   Pulmonary exam normal breath sounds clear to auscultation       Cardiovascular Exercise Tolerance: Good hypertension, Pt. on medications + CAD  negative cardio ROS Normal cardiovascular exam+ Valvular Problems/Murmurs  Rhythm:Regular Rate:Normal     Neuro/Psych   Anxiety     negative neurological ROS  negative psych ROS   GI/Hepatic negative GI ROS, Neg liver ROS,,,  Endo/Other  negative endocrine ROS    Renal/GU negative Renal ROS  negative genitourinary   Musculoskeletal negative musculoskeletal ROS (+) Arthritis ,    Abdominal   Peds negative pediatric ROS (+)  Hematology negative hematology ROS (+) Blood dyscrasia, anemia   Anesthesia Other Findings   Reproductive/Obstetrics negative OB ROS                             Anesthesia Physical Anesthesia Plan  ASA: 3  Anesthesia Plan: MAC, Regional and Spinal   Post-op Pain Management: Minimal or no pain anticipated, Tylenol PO (pre-op)*, Celebrex PO (pre-op)* and Regional block*   Induction: Intravenous  PONV Risk Score and Plan: 1  Airway Management Planned: Simple Face Mask, Mask and Natural Airway  Additional Equipment: None  Intra-op Plan:   Post-operative Plan:   Informed Consent: I have reviewed the patients History and Physical, chart, labs and discussed the procedure including the risks, benefits and alternatives for the proposed anesthesia with the patient or authorized  representative who has indicated his/her understanding and acceptance.       Plan Discussed with: Anesthesiologist and CRNA  Anesthesia Plan Comments: (See PAT note from 2/10 by K Jefm Bryant PA-C    DISCUSSION: Randy [name] is a 78 yo male who presents to PAT prior to surgery above. PMH of HTN, CAD s/p PCI in 2013, heart murmur, arthritis, prediabetes, anxiety   Prior anesthesia complications include prolonged emergence.   Patient follows with Cardiology for CAD s/p PCI in 2013. Myoview negative for ischemia in 2015.  ETT with borderline changes but no symptoms in 2019. Echo in 12/2022 with normal EF 55-60%, grade I DD, no significant valvular abnormalities. Pt reported easy bruising and arthritis pain but no cardiac symptoms. Advised f/u in 1 year. Seen for cardiac clearance on 03/28/23 and cleared:   EKG 11/23/22:   Normal sinus rhythm, rate 71 Low voltage QRS No significant change since Since last tracing   CV:   Echo 12/09/22:   IMPRESSIONS      1. Left ventricular ejection fraction, by estimation, is 55 to 60%. The left ventricle has normal function. The left ventricle has no regional wall motion abnormalities. Left ventricular diastolic parameters are consistent with Grade I diastolic dysfunction (impaired relaxation).  2. Right ventricular systolic function is normal. The right ventricular size is normal. There is normal pulmonary artery systolic pressure. The estimated right ventricular systolic pressure is 17.6 mmHg.  3. The mitral valve is normal in structure. Trivial mitral valve regurgitation. No evidence of mitral stenosis.  4. The aortic valve is tricuspid. There is mild  calcification of the aortic valve. Aortic valve regurgitation is trivial. No aortic stenosis is present.  5. Aortic dilatation noted. There is mild dilatation of the ascending aorta, measuring 39 mm.  6. The inferior vena cava is normal in size with greater than 50% respiratory variability,  suggesting right atrial pressure of 3 mmHg.   Exercise Stress test 11/23/2017:   Blood pressure demonstrated a hypertensive response to exercise. Horizontal ST segment depression ST segment depression of 1 mm was noted during stress in the II, III, aVF, V5 and V6 leads.   Positive ETT 1 mm horizontal ST segment depression in inferior lateral leads with stress HTN response to exercise    )        Anesthesia Quick Evaluation

## 2023-05-20 DIAGNOSIS — Z800 Family history of malignant neoplasm of digestive organs: Secondary | ICD-10-CM | POA: Diagnosis not present

## 2023-05-20 DIAGNOSIS — Z09 Encounter for follow-up examination after completed treatment for conditions other than malignant neoplasm: Secondary | ICD-10-CM | POA: Diagnosis not present

## 2023-05-20 DIAGNOSIS — K649 Unspecified hemorrhoids: Secondary | ICD-10-CM | POA: Diagnosis not present

## 2023-05-20 DIAGNOSIS — K5730 Diverticulosis of large intestine without perforation or abscess without bleeding: Secondary | ICD-10-CM | POA: Diagnosis not present

## 2023-05-20 DIAGNOSIS — Z860100 Personal history of colon polyps, unspecified: Secondary | ICD-10-CM | POA: Diagnosis not present

## 2023-05-26 ENCOUNTER — Encounter (HOSPITAL_COMMUNITY): Payer: Self-pay | Admitting: Orthopedic Surgery

## 2023-05-26 NOTE — Progress Notes (Signed)
 Called and updated patient of change in surgery time.   Aware to be here at 0800 for surgery at 1030.

## 2023-05-27 ENCOUNTER — Encounter (HOSPITAL_COMMUNITY): Payer: Self-pay | Admitting: Orthopedic Surgery

## 2023-05-27 ENCOUNTER — Observation Stay (HOSPITAL_COMMUNITY)
Admission: RE | Admit: 2023-05-27 | Discharge: 2023-05-28 | Disposition: A | Discharge status: Home / Self Care | Payer: Medicare PPO | Attending: Orthopedic Surgery | Admitting: Orthopedic Surgery

## 2023-05-27 ENCOUNTER — Encounter (HOSPITAL_COMMUNITY)
Admission: RE | Disposition: A | Discharge status: Home / Self Care | Payer: Self-pay | Source: Home / Self Care | Attending: Orthopedic Surgery

## 2023-05-27 ENCOUNTER — Ambulatory Visit (HOSPITAL_COMMUNITY): Payer: Self-pay | Admitting: Anesthesiology

## 2023-05-27 ENCOUNTER — Ambulatory Visit (HOSPITAL_COMMUNITY): Payer: Medicare PPO | Admitting: Medical

## 2023-05-27 ENCOUNTER — Other Ambulatory Visit: Payer: Self-pay

## 2023-05-27 DIAGNOSIS — I10 Essential (primary) hypertension: Secondary | ICD-10-CM | POA: Diagnosis not present

## 2023-05-27 DIAGNOSIS — Z955 Presence of coronary angioplasty implant and graft: Secondary | ICD-10-CM | POA: Insufficient documentation

## 2023-05-27 DIAGNOSIS — Z85828 Personal history of other malignant neoplasm of skin: Secondary | ICD-10-CM | POA: Diagnosis not present

## 2023-05-27 DIAGNOSIS — Z79899 Other long term (current) drug therapy: Secondary | ICD-10-CM | POA: Insufficient documentation

## 2023-05-27 DIAGNOSIS — Z87891 Personal history of nicotine dependence: Secondary | ICD-10-CM | POA: Insufficient documentation

## 2023-05-27 DIAGNOSIS — Z96651 Presence of right artificial knee joint: Principal | ICD-10-CM

## 2023-05-27 DIAGNOSIS — I2510 Atherosclerotic heart disease of native coronary artery without angina pectoris: Secondary | ICD-10-CM

## 2023-05-27 DIAGNOSIS — G8918 Other acute postprocedural pain: Secondary | ICD-10-CM | POA: Diagnosis not present

## 2023-05-27 DIAGNOSIS — M1711 Unilateral primary osteoarthritis, right knee: Secondary | ICD-10-CM | POA: Diagnosis not present

## 2023-05-27 DIAGNOSIS — Z7982 Long term (current) use of aspirin: Secondary | ICD-10-CM | POA: Insufficient documentation

## 2023-05-27 HISTORY — PX: TOTAL KNEE ARTHROPLASTY: SHX125

## 2023-05-27 SURGERY — ARTHROPLASTY, KNEE, TOTAL
Anesthesia: Monitor Anesthesia Care | Site: Knee | Laterality: Right

## 2023-05-27 MED ORDER — ONDANSETRON HCL 4 MG PO TABS: 4.0000 mg | ORAL_TABLET | Freq: Three times a day (TID) | ORAL | 1 refills | Status: AC | PRN

## 2023-05-27 MED ORDER — ACETAMINOPHEN 325 MG PO TABS: 325.0000 mg | ORAL_TABLET | ORAL | Status: DC | PRN

## 2023-05-27 MED ORDER — CHLORHEXIDINE GLUCONATE 0.12 % MT SOLN
15.0000 mL | Freq: Once | OROMUCOSAL | Status: AC
  Administered 2023-05-27: 15 mL via OROMUCOSAL

## 2023-05-27 MED ORDER — HYDROCHLOROTHIAZIDE 25 MG PO TABS
25.0000 mg | ORAL_TABLET | Freq: Every morning | ORAL | Status: DC
Start: 2023-05-28 — End: 2023-05-28
  Administered 2023-05-28: 25 mg via ORAL
  Filled 2023-05-27: qty 1

## 2023-05-27 MED ORDER — METOPROLOL SUCCINATE ER 50 MG PO TB24
50.0000 mg | ORAL_TABLET | Freq: Every day | ORAL | Status: DC
  Administered 2023-05-27 – 2023-05-28 (×2): 50 mg via ORAL
  Filled 2023-05-27 (×2): qty 1

## 2023-05-27 MED ORDER — ACETAMINOPHEN 325 MG PO TABS: 325.0000 mg | ORAL_TABLET | Freq: Four times a day (QID) | ORAL | Status: DC | PRN

## 2023-05-27 MED ORDER — SODIUM CHLORIDE (PF) 0.9 % IJ SOLN
INTRAMUSCULAR | Status: AC
  Filled 2023-05-27: qty 30

## 2023-05-27 MED ORDER — MEPERIDINE HCL 50 MG/ML IJ SOLN: 6.2500 mg | INTRAMUSCULAR | Status: DC | PRN

## 2023-05-27 MED ORDER — DEXAMETHASONE SODIUM PHOSPHATE 10 MG/ML IJ SOLN
INTRAMUSCULAR | Status: DC | PRN
Start: 2023-05-27 — End: 2023-05-27
  Administered 2023-05-27: 8 mg via INTRAVENOUS

## 2023-05-27 MED ORDER — CEFAZOLIN SODIUM-DEXTROSE 2-4 GM/100ML-% IV SOLN
2.0000 g | Freq: Four times a day (QID) | INTRAVENOUS | Status: AC
  Administered 2023-05-27 (×2): 2 g via INTRAVENOUS
  Filled 2023-05-27 (×2): qty 100

## 2023-05-27 MED ORDER — ROPIVACAINE HCL 5 MG/ML IJ SOLN
INTRAMUSCULAR | Status: DC | PRN
Start: 2023-05-27 — End: 2023-05-27
  Administered 2023-05-27: 20 mL via PERINEURAL

## 2023-05-27 MED ORDER — MENTHOL 3 MG MT LOZG: 1.0000 | LOZENGE | OROMUCOSAL | Status: DC | PRN

## 2023-05-27 MED ORDER — PROPOFOL 10 MG/ML IV BOLUS
INTRAVENOUS | Status: DC | PRN
  Administered 2023-05-27: 20 mg via INTRAVENOUS

## 2023-05-27 MED ORDER — ADULT MULTIVITAMIN W/MINERALS CH
1.0000 | ORAL_TABLET | Freq: Every morning | ORAL | Status: DC
Start: 2023-05-28 — End: 2023-05-28
  Administered 2023-05-28: 1 via ORAL
  Filled 2023-05-27: qty 1

## 2023-05-27 MED ORDER — PROPOFOL 500 MG/50ML IV EMUL
INTRAVENOUS | Status: DC | PRN
  Administered 2023-05-27: 75 ug/kg/min via INTRAVENOUS

## 2023-05-27 MED ORDER — SODIUM CHLORIDE (PF) 0.9 % IJ SOLN
INTRAMUSCULAR | Status: DC | PRN
  Administered 2023-05-27: 30 mL

## 2023-05-27 MED ORDER — OXYCODONE-ACETAMINOPHEN 5-325 MG PO TABS: 1.0000 | ORAL_TABLET | ORAL | 0 refills | Status: AC | PRN

## 2023-05-27 MED ORDER — ONDANSETRON HCL 4 MG/2ML IJ SOLN
INTRAMUSCULAR | Status: DC | PRN
  Administered 2023-05-27: 4 mg via INTRAVENOUS

## 2023-05-27 MED ORDER — METHOCARBAMOL 1000 MG/10ML IJ SOLN: 500.0000 mg | Freq: Four times a day (QID) | INTRAMUSCULAR | Status: DC | PRN

## 2023-05-27 MED ORDER — BENAZEPRIL HCL 20 MG PO TABS
40.0000 mg | ORAL_TABLET | Freq: Two times a day (BID) | ORAL | Status: DC
  Administered 2023-05-27 – 2023-05-28 (×2): 40 mg via ORAL
  Filled 2023-05-27 (×2): qty 2

## 2023-05-27 MED ORDER — BUPIVACAINE-EPINEPHRINE 0.25% -1:200000 IJ SOLN
INTRAMUSCULAR | Status: AC
Start: 2023-05-27 — End: ?
  Filled 2023-05-27: qty 1

## 2023-05-27 MED ORDER — MIDAZOLAM HCL 2 MG/2ML IJ SOLN
1.0000 mg | INTRAMUSCULAR | Status: DC
  Administered 2023-05-27: 2 mg via INTRAVENOUS
  Filled 2023-05-27: qty 2

## 2023-05-27 MED ORDER — OXYCODONE HCL 5 MG PO TABS: 5.0000 mg | ORAL_TABLET | Freq: Once | ORAL | Status: DC | PRN

## 2023-05-27 MED ORDER — CEFAZOLIN SODIUM-DEXTROSE 2-4 GM/100ML-% IV SOLN
2.0000 g | INTRAVENOUS | Status: AC
Start: 2023-05-27 — End: 2023-05-27
  Administered 2023-05-27: 2 g via INTRAVENOUS
  Filled 2023-05-27: qty 100

## 2023-05-27 MED ORDER — WATER FOR IRRIGATION, STERILE IR SOLN
Status: DC | PRN
  Administered 2023-05-27: 1000 mL

## 2023-05-27 MED ORDER — PROPOFOL 10 MG/ML IV BOLUS
INTRAVENOUS | Status: AC
  Filled 2023-05-27: qty 20

## 2023-05-27 MED ORDER — METHOCARBAMOL 500 MG PO TABS
500.0000 mg | ORAL_TABLET | Freq: Four times a day (QID) | ORAL | Status: DC | PRN
  Administered 2023-05-27: 500 mg via ORAL
  Filled 2023-05-27: qty 1

## 2023-05-27 MED ORDER — FENTANYL CITRATE PF 50 MCG/ML IJ SOSY: 25.0000 ug | PREFILLED_SYRINGE | INTRAMUSCULAR | Status: DC | PRN

## 2023-05-27 MED ORDER — METOCLOPRAMIDE HCL 5 MG PO TABS: 5.0000 mg | ORAL_TABLET | Freq: Three times a day (TID) | ORAL | Status: DC | PRN

## 2023-05-27 MED ORDER — FENTANYL CITRATE PF 50 MCG/ML IJ SOSY
50.0000 ug | PREFILLED_SYRINGE | INTRAMUSCULAR | Status: DC
  Administered 2023-05-27: 50 ug via INTRAVENOUS
  Filled 2023-05-27: qty 2

## 2023-05-27 MED ORDER — BUPIVACAINE LIPOSOME 1.3 % IJ SUSP: 20.0000 mL | Freq: Once | INTRAMUSCULAR | Status: DC

## 2023-05-27 MED ORDER — ASPIRIN 81 MG PO CHEW
81.0000 mg | CHEWABLE_TABLET | Freq: Two times a day (BID) | ORAL | Status: DC
  Administered 2023-05-27 – 2023-05-28 (×2): 81 mg via ORAL
  Filled 2023-05-27 (×2): qty 1

## 2023-05-27 MED ORDER — LIDOCAINE 2% (20 MG/ML) 5 ML SYRINGE
INTRAMUSCULAR | Status: DC | PRN
Start: 2023-05-27 — End: 2023-05-27
  Administered 2023-05-27: 50 mg via INTRAVENOUS

## 2023-05-27 MED ORDER — ONDANSETRON HCL 4 MG/2ML IJ SOLN: 4.0000 mg | Freq: Once | INTRAMUSCULAR | Status: DC | PRN

## 2023-05-27 MED ORDER — ORAL CARE MOUTH RINSE: 15.0000 mL | Freq: Once | OROMUCOSAL | Status: AC

## 2023-05-27 MED ORDER — PHENOL 1.4 % MT LIQD: 1.0000 | OROMUCOSAL | Status: DC | PRN

## 2023-05-27 MED ORDER — ACETAMINOPHEN 160 MG/5ML PO SOLN: 325.0000 mg | ORAL | Status: DC | PRN

## 2023-05-27 MED ORDER — 0.9 % SODIUM CHLORIDE (POUR BTL) OPTIME
TOPICAL | Status: DC | PRN
  Administered 2023-05-27: 1000 mL

## 2023-05-27 MED ORDER — TRANEXAMIC ACID-NACL 1000-0.7 MG/100ML-% IV SOLN
1000.0000 mg | INTRAVENOUS | Status: AC
  Administered 2023-05-27: 1000 mg via INTRAVENOUS
  Filled 2023-05-27: qty 100

## 2023-05-27 MED ORDER — POLYETHYLENE GLYCOL 3350 17 G PO PACK
17.0000 g | PACK | Freq: Every day | ORAL | Status: DC | PRN
  Administered 2023-05-27: 17 g via ORAL
  Filled 2023-05-27: qty 1

## 2023-05-27 MED ORDER — BUPIVACAINE LIPOSOME 1.3 % IJ SUSP
INTRAMUSCULAR | Status: AC
  Filled 2023-05-27: qty 20

## 2023-05-27 MED ORDER — OXYCODONE HCL 5 MG PO TABS
5.0000 mg | ORAL_TABLET | ORAL | Status: DC | PRN
  Administered 2023-05-27: 5 mg via ORAL
  Administered 2023-05-27 – 2023-05-28 (×3): 10 mg via ORAL
  Filled 2023-05-27: qty 1
  Filled 2023-05-27 (×4): qty 2

## 2023-05-27 MED ORDER — SODIUM CHLORIDE 0.9% FLUSH
3.0000 mL | Freq: Two times a day (BID) | INTRAVENOUS | Status: DC
  Administered 2023-05-27 – 2023-05-28 (×2): 3 mL via INTRAVENOUS

## 2023-05-27 MED ORDER — OXYCODONE HCL 5 MG/5ML PO SOLN: 5.0000 mg | Freq: Once | ORAL | Status: DC | PRN

## 2023-05-27 MED ORDER — ATORVASTATIN CALCIUM 40 MG PO TABS
40.0000 mg | ORAL_TABLET | Freq: Every day | ORAL | Status: DC
  Administered 2023-05-27: 40 mg via ORAL
  Filled 2023-05-27: qty 1

## 2023-05-27 MED ORDER — MELATONIN 5 MG PO TABS
5.0000 mg | ORAL_TABLET | Freq: Every day | ORAL | Status: DC
  Administered 2023-05-27: 5 mg via ORAL
  Filled 2023-05-27: qty 1

## 2023-05-27 MED ORDER — ASPIRIN EC 81 MG PO TBEC: 81.0000 mg | DELAYED_RELEASE_TABLET | Freq: Two times a day (BID) | ORAL | 0 refills | Status: AC

## 2023-05-27 MED ORDER — DOCUSATE SODIUM 100 MG PO CAPS: 100.0000 mg | ORAL_CAPSULE | Freq: Two times a day (BID) | ORAL | Status: DC

## 2023-05-27 MED ORDER — DOCUSATE SODIUM 100 MG PO CAPS
100.0000 mg | ORAL_CAPSULE | Freq: Two times a day (BID) | ORAL | Status: DC
  Administered 2023-05-27 – 2023-05-28 (×2): 100 mg via ORAL
  Filled 2023-05-27 (×2): qty 1

## 2023-05-27 MED ORDER — ACETAMINOPHEN 500 MG PO TABS
1000.0000 mg | ORAL_TABLET | Freq: Once | ORAL | Status: AC
  Administered 2023-05-27: 1000 mg via ORAL
  Filled 2023-05-27: qty 2

## 2023-05-27 MED ORDER — CO Q 10 10 MG PO CAPS: 300.0000 mg | ORAL_CAPSULE | Freq: Every day | ORAL | Status: DC

## 2023-05-27 MED ORDER — TRANEXAMIC ACID-NACL 1000-0.7 MG/100ML-% IV SOLN
1000.0000 mg | Freq: Once | INTRAVENOUS | Status: AC
  Administered 2023-05-27: 1000 mg via INTRAVENOUS
  Filled 2023-05-27: qty 100

## 2023-05-27 MED ORDER — NITROGLYCERIN 0.4 MG SL SUBL: 0.4000 mg | SUBLINGUAL_TABLET | SUBLINGUAL | Status: DC | PRN

## 2023-05-27 MED ORDER — LACTATED RINGERS IV SOLN: INTRAVENOUS | Status: DC

## 2023-05-27 MED ORDER — METHOCARBAMOL 500 MG PO TABS: 500.0000 mg | ORAL_TABLET | Freq: Three times a day (TID) | ORAL | 1 refills | Status: AC | PRN

## 2023-05-27 MED ORDER — HYDROMORPHONE HCL 1 MG/ML IJ SOLN
0.5000 mg | INTRAMUSCULAR | Status: DC | PRN
  Filled 2023-05-27: qty 1

## 2023-05-27 MED ORDER — METOCLOPRAMIDE HCL 5 MG/ML IJ SOLN: 5.0000 mg | Freq: Three times a day (TID) | INTRAMUSCULAR | Status: DC | PRN

## 2023-05-27 MED ORDER — BUPIVACAINE IN DEXTROSE 0.75-8.25 % IT SOLN
INTRATHECAL | Status: DC | PRN
  Administered 2023-05-27: 1.8 mL via INTRATHECAL

## 2023-05-27 MED ORDER — SODIUM CHLORIDE 0.9% FLUSH: 3.0000 mL | INTRAVENOUS | Status: DC | PRN

## 2023-05-27 MED ORDER — SODIUM CHLORIDE 0.9 % IR SOLN
Status: DC | PRN
  Administered 2023-05-27: 1000 mL

## 2023-05-27 MED ORDER — LORATADINE 10 MG PO TABS: 10.0000 mg | ORAL_TABLET | Freq: Every day | ORAL | Status: DC | PRN

## 2023-05-27 MED ORDER — PHENYLEPHRINE HCL-NACL 20-0.9 MG/250ML-% IV SOLN
INTRAVENOUS | Status: DC | PRN
  Administered 2023-05-27: 25 ug/min via INTRAVENOUS

## 2023-05-27 MED ORDER — BUPIVACAINE-EPINEPHRINE 0.25% -1:200000 IJ SOLN
INTRAMUSCULAR | Status: DC | PRN
  Administered 2023-05-27: 30 mL

## 2023-05-27 MED ORDER — POVIDONE-IODINE 10 % EX SWAB: 2.0000 | Freq: Once | CUTANEOUS | Status: DC

## 2023-05-27 MED ORDER — CELECOXIB 200 MG PO CAPS
200.0000 mg | ORAL_CAPSULE | Freq: Once | ORAL | Status: AC
  Administered 2023-05-27: 200 mg via ORAL
  Filled 2023-05-27: qty 1

## 2023-05-27 MED ORDER — BUPIVACAINE LIPOSOME 1.3 % IJ SUSP
INTRAMUSCULAR | Status: DC | PRN
  Administered 2023-05-27: 20 mL

## 2023-05-27 SURGICAL SUPPLY — 45 items
ATTUNE MED DOME PAT 41 KNEE (Knees) IMPLANT
ATTUNE PS FEM RT SZ 7 CEM KNEE (Femur) IMPLANT
ATTUNE PSRP INSR SZ7 7 KNEE (Insert) IMPLANT
BAG COUNTER SPONGE SURGICOUNT (BAG) IMPLANT
BAG ZIPLOCK 12X15 (MISCELLANEOUS) IMPLANT
BASE TIBIAL ROT PLAT SZ 7 KNEE (Knees) IMPLANT
BLADE SAG 18X100X1.27 (BLADE) ×1 IMPLANT
BLADE SAW SGTL 13X75X1.27 (BLADE) ×1 IMPLANT
BNDG ELASTIC 6X10 VLCR STRL LF (GAUZE/BANDAGES/DRESSINGS) ×1 IMPLANT
BNDG GAUZE DERMACEA FLUFF 4 (GAUZE/BANDAGES/DRESSINGS) ×1 IMPLANT
BOWL SMART MIX CTS (DISPOSABLE) ×1 IMPLANT
CEMENT HV SMART SET (Cement) ×2 IMPLANT
COVER SURGICAL LIGHT HANDLE (MISCELLANEOUS) ×1 IMPLANT
CUFF TRNQT CYL 34X4.125X (TOURNIQUET CUFF) ×1 IMPLANT
DRAPE INCISE IOBAN 66X45 STRL (DRAPES) IMPLANT
DRAPE SHEET LG 3/4 BI-LAMINATE (DRAPES) ×1 IMPLANT
DRAPE U-SHAPE 47X51 STRL (DRAPES) ×1 IMPLANT
DRSG ADAPTIC 3X8 NADH LF (GAUZE/BANDAGES/DRESSINGS) ×1 IMPLANT
DURAPREP 26ML APPLICATOR (WOUND CARE) ×1 IMPLANT
ELECT REM PT RETURN 15FT ADLT (MISCELLANEOUS) ×1 IMPLANT
GAUZE PAD ABD 8X10 STRL (GAUZE/BANDAGES/DRESSINGS) ×1 IMPLANT
GAUZE SPONGE 4X4 12PLY STRL (GAUZE/BANDAGES/DRESSINGS) ×1 IMPLANT
GLOVE BIOGEL PI IND STRL 7.5 (GLOVE) ×1 IMPLANT
GLOVE BIOGEL PI IND STRL 8.5 (GLOVE) ×1 IMPLANT
GLOVE ORTHO TXT STRL SZ7.5 (GLOVE) ×1 IMPLANT
GLOVE SURG ORTHO 8.5 STRL (GLOVE) ×1 IMPLANT
GOWN STRL REUS W/ TWL XL LVL3 (GOWN DISPOSABLE) ×2 IMPLANT
IMMOBILIZER KNEE 20 (SOFTGOODS) ×1 IMPLANT
IMMOBILIZER KNEE 20 THIGH 36 (SOFTGOODS) ×1 IMPLANT
KIT TURNOVER KIT A (KITS) IMPLANT
MANIFOLD NEPTUNE II (INSTRUMENTS) ×1 IMPLANT
NS IRRIG 1000ML POUR BTL (IV SOLUTION) ×1 IMPLANT
PACK TOTAL KNEE CUSTOM (KITS) ×1 IMPLANT
PIN STEINMAN FIXATION KNEE (PIN) IMPLANT
PROTECTOR NERVE ULNAR (MISCELLANEOUS) ×1 IMPLANT
SET HNDPC FAN SPRY TIP SCT (DISPOSABLE) ×1 IMPLANT
STRIP CLOSURE SKIN 1/2X4 (GAUZE/BANDAGES/DRESSINGS) ×2 IMPLANT
SUT MNCRL AB 3-0 PS2 18 (SUTURE) ×1 IMPLANT
SUT VIC AB 0 CT1 36 (SUTURE) ×1 IMPLANT
SUT VIC AB 1 CT1 36 (SUTURE) ×2 IMPLANT
SUT VIC AB 2-0 CT1 TAPERPNT 27 (SUTURE) ×2 IMPLANT
TIBIAL BASE ROT PLAT SZ 7 KNEE (Knees) ×1 IMPLANT
TRAY CATH INTERMITTENT SS 16FR (CATHETERS) ×1 IMPLANT
WATER STERILE IRR 1000ML POUR (IV SOLUTION) ×2 IMPLANT
YANKAUER SUCT BULB TIP NO VENT (SUCTIONS) ×1 IMPLANT

## 2023-05-27 NOTE — Progress Notes (Signed)
 Orthopedic Tech Progress Note Patient Details:  Randy [name] January 23, 1946 [id number] CPM has been removed and bone foam was applied  Patient ID: Randy [name], male   DOB: 1945-11-27, 78 y.o.   MRN: [id number]  Randy [name] 05/27/2023, 8:49 PM

## 2023-05-27 NOTE — Brief Op Note (Signed)
 05/27/2023  12:36 PM  PATIENT:  Randy [name]  77 y.o. male  PRE-OPERATIVE DIAGNOSIS:  Right knee osteoarthritis, end stage  POST-OPERATIVE DIAGNOSIS:  Right knee osteoarthritis, end stage  PROCEDURE:  Right total knee arthroplasty, DePuy Attune  SURGEON:  Surgeons and Role:    Beverely Low, MD - Primary  PHYSICIAN ASSISTANT:   ASSISTANTS: Thea Gist, PA-C   ANESTHESIA:   regional and spinal  EBL:  50 mL   BLOOD ADMINISTERED:none  DRAINS: none   LOCAL MEDICATIONS USED:  MARCAINE     SPECIMEN:  No Specimen  DISPOSITION OF SPECIMEN:  N/A  COUNTS:  YES  TOURNIQUET:  80 minutes at 300 mm Hg  DICTATION: .Other Dictation: Dictation Number [id number]  PLAN OF CARE: Admit for overnight observation  PATIENT DISPOSITION:  PACU - hemodynamically stable.   Delay start of Pharmacological VTE agent (>24hrs) due to surgical blood loss or risk of bleeding: no

## 2023-05-27 NOTE — Interval H&P Note (Signed)
 History and Physical Interval Note:  05/27/2023 9:03 AM  Randy [name]  has presented today for surgery, with the diagnosis of Right knee osteoarthritis.  The various methods of treatment have been discussed with the patient and family. After consideration of risks, benefits and other options for treatment, the patient has consented to  Procedure(s) with comments: TOTAL KNEE ARTHROPLASTY (Right) - and general , put in flip room as a surgical intervention.  The patient's history has been reviewed, patient examined, no change in status, stable for surgery.  I have reviewed the patient's chart and labs.  Questions were answered to the patient's satisfaction.     Verlee Rossetti

## 2023-05-27 NOTE — Anesthesia Procedure Notes (Addendum)
 Anesthesia Regional Block: Adductor canal block   Pre-Anesthetic Checklist: , timeout performed,  Correct Patient, Correct Site, Correct Laterality,  Correct Procedure, Correct Position, site marked,  Risks and benefits discussed,  Surgical consent,  Pre-op evaluation,  At surgeon's request and post-op pain management  Laterality: Right  Prep: chloraprep       Needles:  Injection technique: Single-shot  Needle Type: Echogenic Stimulator Needle     Needle Length: 5cm  Needle Gauge: 22     Additional Needles:   Procedures:, nerve stimulator,,, ultrasound used (permanent image in chart),,     Nerve Stimulator or Paresthesia:  Response: quadraceps contraction, 0.45 mA  Additional Responses:   Narrative:  Start time: 05/27/2023 9:20 AM End time: 05/27/2023 9:25 AM Injection made incrementally with aspirations every 5 mL.  Performed by: Personally  Anesthesiologist: Bethena Midget, MD  Additional Notes: Functioning IV was confirmed and monitors were applied.  A 50mm 22ga Arrow echogenic stimulator needle was used. Sterile prep and drape,hand hygiene and sterile gloves were used. Ultrasound guidance: relevant anatomy identified, needle position confirmed, local anesthetic spread visualized around nerve(s)., vascular puncture avoided.  Image printed for medical record. Negative aspiration and negative test dose prior to incremental administration of local anesthetic. The patient tolerated the procedure well.

## 2023-05-27 NOTE — Anesthesia Postprocedure Evaluation (Signed)
 Anesthesia Post Note  Patient: Randy [name]  Procedure(s) Performed: TOTAL KNEE ARTHROPLASTY (Right: Knee)     Patient location during evaluation: Mother Baby Anesthesia Type: Regional, MAC and Spinal Level of consciousness: oriented and awake and alert Pain management: pain level controlled Vital Signs Assessment: post-procedure vital signs reviewed and stable Respiratory status: spontaneous breathing and respiratory function stable Cardiovascular status: blood pressure returned to baseline and stable Postop Assessment: no headache, no backache, no apparent nausea or vomiting and able to ambulate Anesthetic complications: no   No notable events documented.  Last Vitals:  Vitals:   05/27/23 0930 05/27/23 1300  BP: 118/74 110/71  Pulse: 66 65  Resp: 14 10  Temp:    SpO2: 96% 100%    Last Pain:  Vitals:   05/27/23 0930  TempSrc:   PainSc: Asleep                 Lasharn Bufkin

## 2023-05-27 NOTE — Op Note (Signed)
 NAME: Randy [name], VUE MEDICAL RECORD NO: [id number] ACCOUNT NO: [id number] DATE OF BIRTH: 1945/05/31 FACILITY: [facility] LOCATION: [facility] PHYSICIAN: Almedia Balls. Ranell Patrick, MD  Operative Report   DATE OF PROCEDURE: 05/27/2023  PREOPERATIVE DIAGNOSIS:  Right knee end-stage arthritis.  POSTOPERATIVE DIAGNOSIS:  Right knee end-stage arthritis.  PROCEDURE PERFORMED:  Right total knee arthroplasty using Depuy Attune prosthesis.  ATTENDING SURGEON:  Almedia Balls. Ranell Patrick, MD  ASSISTANT:  Konrad Felix Dixon, [place], who was scrubbed during the entire procedure, and necessary for satisfactory completion of surgery.  ANESTHESIA:  Spinal anesthesia plus adductor canal block was utilized.  ESTIMATED BLOOD LOSS:  Minimal.  FLUID REPLACEMENT:  1200 mL of crystalloid.  COUNTS:  Instrument counts were correct.  COMPLICATIONS:  No complications.  ANTIBIOTICS:  Perioperative antibiotics were given.  INDICATIONS:  The patient is a 78 year old male who presents with a history of worsening right knee pain due to end-stage arthritis, bone-on-bone.  The patient has had an extended period of conservative management and failed that.  He presents for  operative total knee arthroplasty to eliminate pain and restore function.  Informed consent obtained.  DESCRIPTION OF PROCEDURE:  After adequate level of anesthesia was achieved, the patient was positioned supine on the operating room table.  A nonsterile tourniquet was placed on the right proximal thigh.  The right leg was sterilely prepped and draped in  the usual manner.  Timeout called verifying correct patient and correct site.  We elevated the leg and exsanguinated with an Esmarch bandage inflating the tourniquet to 300 mmHg.  We then placed the knee in flexion and performed a longitudinal midline  incision with a 10 blade scalpel.  We used a fresh 10 blade scalpel to perform a medial parapatellar arthrotomy.  We then divided the lateral patellofemoral ligaments  everting the patella and exposing the distal femur, which was devoid of cartilage.  We  entered the distal femur with a step-cut drill.  We then went ahead and placed our intramedullary guide and resected 9 mm off the distal femur set on 5 degrees of valgus.  We then sized the femur to a size 7, anterior down, performing anterior,  posterior, and chamfer cuts with a 4-in-1 block.  Next, we removed ACL and PCL meniscal tissues using the Bovie.  We then subluxed the tibia anteriorly gaining good exposure of the proximal tibia.  We placed our external alignment jig and cut the tibia  at 90 degrees perpendicular to the long axis of the tibia with minimal posterior slope resecting 2 mm off the affected medial side.  Once we had our tibial cut done, we placed a lamina spreader to remove posterior femoral condyle osteophytes.  We then  released the posterior capsule, which was tight.  We also injected the posterior capsule with a combination of Marcaine, Exparel and saline for postop pain control.  Next, we checked our gaps, which were symmetric at 6 mm.  We then went ahead and  finished our tibial preparation for the 7 tibia using the modular drill and keel punch.  With the trial in place, we went to the femur and cut the box for the 7 right femur.  We placed a 5-mm poly trial in place, reduced the knee, and we are happy with  our stability.  We then went to the patella and resurfaced going from a 28-mm thickness down to a 17-mm thickness with the oscillating saw.  Using the cutting guide, we drilled lug holes for the 41 patellar button and  then placed the trial button in  place, ranging the knee and had excellent patellar tracking with no touch technique.  We then removed all trial components, irrigated thoroughly.  We dried the bone well, vacuum mixed high viscosity cement, cemented the components into place all in one  step including tibia, femur, and patella.  We placed the knee in extension with a 5-mm poly  trial and used a patellar clamp while the cement set up.  We had the knee in extension for good compression.  After the cement was hardened, we went ahead and  injected the anterior capsule with a combination of Marcaine and Exparel and saline.  We then trialed and felt like we could get the 7 mm poly in.  The 6 would be better, but not quite enough, so we went ahead and selected the real size 7 7-mm poly and  after we ensured there was no more cement remaining, we placed a 7-mm poly on the tray and reduced the knee.  A nice little pop as the medial epicondyle reduced.  We were able to get full extension and great flexion and stability.  At this point, we  irrigated again and then closed the parapatellar arthrotomy with #1 Vicryl suture followed by 2-0 Vicryl for subcutaneous closure and 4 running Monocryl for skin.  Steri-Strips were applied followed by a sterile dressing.  The patient tolerated surgery  well.   PUS D: 05/27/2023 12:41:12 pm T: 05/27/2023 1:04:00 pm  JOB: [id number]/ [id number]

## 2023-05-27 NOTE — Anesthesia Procedure Notes (Signed)
 Spinal  Patient location during procedure: OR Start time: 05/27/2023 10:48 AM End time: 05/27/2023 10:53 AM Reason for block: surgical anesthesia Staffing Anesthesiologist: Bethena Midget, MD Performed by: Bethena Midget, MD Authorized by: Bethena Midget, MD   Preanesthetic Checklist Completed: patient identified, IV checked, site marked, risks and benefits discussed, surgical consent, monitors and equipment checked, pre-op evaluation and timeout performed Spinal Block Patient position: sitting Prep: DuraPrep Patient monitoring: heart rate, cardiac monitor, continuous pulse ox and blood pressure Approach: midline Location: L3-4 Injection technique: single-shot Needle Needle type: Sprotte  Needle gauge: 24 G Needle length: 9 cm Assessment Sensory level: T4 Events: paresthesia and CSF return Additional Notes Started @l  L4/5 consistent osso on passes,  Change to one level up + csf / dose / + csf  Paraesthesia transient

## 2023-05-27 NOTE — Evaluation (Signed)
 Physical Therapy Evaluation Patient Details Name: Randy [name] MRN: [id number] DOB: 22-Oct-1945 Today's Date: 05/27/2023  History of Present Illness  78 yo male presents to therapy s/p R TKA on 05/27/2023 due to failure of conservative measures. Pt PMH includes but is not limited to: anemia, anxiety, CAD s/p cath, HLD, and HTN.  Clinical Impression      Randy [name] is a 78 y.o. male POD 0 s/p R TKA. Patient reports IND with mobility at baseline. Patient is now limited by functional impairments (see PT problem list below) and requires min A for bed mobility and CGA for transfers. Patient was able to ambulate 40 feet with RW and CGA level of assist. Patient instructed in exercise to facilitate ROM and circulation to manage edema. Patient will benefit from continued skilled PT interventions to address impairments and progress towards PLOF. Acute PT will follow to progress mobility and stair training in preparation for safe discharge home with family support and OPPT services.     If plan is discharge home, recommend the following: A little help with walking and/or transfers;A little help with bathing/dressing/bathroom;Assistance with cooking/housework;Assist for transportation;Help with stairs or ramp for entrance   Can travel by private vehicle        Equipment Recommendations Rolling walker (2 wheels)  Recommendations for Other Services       Functional Status Assessment Patient has had a recent decline in their functional status and demonstrates the ability to make significant improvements in function in a reasonable and predictable amount of time.     Precautions / Restrictions Precautions Precautions: Knee;Fall Restrictions Weight Bearing Restrictions Per Provider Order: No      Mobility  Bed Mobility Overal bed mobility: Needs Assistance Bed Mobility: Supine to Sit, Sit to Supine     Supine to sit: Contact guard, HOB elevated, Used rails Sit to supine: Min assist    General bed mobility comments: min A for R LE to return to bed, min cues    Transfers Overall transfer level: Needs assistance Equipment used: Rolling walker (2 wheels) Transfers: Sit to/from Stand Sit to Stand: Contact guard assist           General transfer comment: min cues pt able to push to stand from EOB    Ambulation/Gait Ambulation/Gait assistance: Contact guard assist Gait Distance (Feet): 40 Feet Assistive device: Rolling walker (2 wheels) Gait Pattern/deviations: Step-to pattern, Antalgic, Trunk flexed Gait velocity: decreased     General Gait Details: slight trunk flexion with B UE support at RW to offload R LE in stance phase, min cues for safety and posture as well as maintaining proper distance from [employer]            Wheelchair Mobility     Tilt Bed    Modified Rankin (Stroke Patients Only)       Balance Overall balance assessment: Needs assistance Sitting-balance support: Feet supported Sitting balance-Leahy Scale: Good     Standing balance support: Bilateral upper extremity supported, During functional activity, Reliant on assistive device for balance Standing balance-Leahy Scale: Poor                               Pertinent Vitals/Pain Pain Assessment Pain Assessment: 0-10 Pain Score: 6  Pain Location: R knee and LE Pain Descriptors / Indicators: Aching, Constant, Discomfort, Dull, Grimacing, Operative site guarding Pain Intervention(s): Limited activity within patient's tolerance, Monitored during session, Repositioned, Patient requesting  pain meds-RN notified, Ice applied    Home Living Family/patient expects to be discharged to:: Private residence Living Arrangements: Spouse/significant other Available Help at Discharge: Family Type of Home: House Home Access: Level entry       Home Layout: Two level;Able to live on main level with bedroom/bathroom;1/2 bath on main level Home Equipment: None      Prior  Function Prior Level of Function : Independent/Modified Independent;Driving             Mobility Comments: IND no AD for all ADLs, self care tasks and IADLs       Extremity/Trunk Assessment        Lower Extremity Assessment Lower Extremity Assessment: RLE deficits/detail RLE Deficits / Details: ankle DF/PF 5/5; SLR < 10 degree lag RLE Sensation: WNL    Cervical / Trunk Assessment Cervical / Trunk Assessment: Normal  Communication   Communication Communication: No apparent difficulties    Cognition Arousal: Alert Behavior During Therapy: WFL for tasks assessed/performed   PT - Cognitive impairments: No apparent impairments                         Following commands: Intact       Cueing       General Comments      Exercises     Assessment/Plan    PT Assessment Patient needs continued PT services  PT Problem List Decreased strength;Decreased range of motion;Decreased activity tolerance;Decreased balance;Decreased mobility;Decreased coordination;Pain       PT Treatment Interventions DME instruction;Gait training;Functional mobility training;Therapeutic activities;Therapeutic exercise;Balance training;Neuromuscular re-education;Patient/family education;Modalities    PT Goals (Current goals can be found in the Care Plan section)  Acute Rehab PT Goals Patient Stated Goal: to be able to get back on the golf course and have L knee done PT Goal Formulation: With patient Time For Goal Achievement: 06/17/23 Potential to Achieve Goals: Good    Frequency 7X/week     Co-evaluation               AM-PAC PT "6 Clicks" Mobility  Outcome Measure Help needed turning from your back to your side while in a flat bed without using bedrails?: None Help needed moving from lying on your back to sitting on the side of a flat bed without using bedrails?: A Little Help needed moving to and from a bed to a chair (including a wheelchair)?: A Little Help needed  standing up from a chair using your arms (e.g., wheelchair or bedside chair)?: A Little Help needed to walk in hospital room?: A Little Help needed climbing 3-5 steps with a railing? : A Lot 6 Click Score: 18    End of Session Equipment Utilized During Treatment: Gait belt Activity Tolerance: Patient tolerated treatment well;No increased pain Patient left: in bed;with call bell/phone within reach;with family/visitor present;Other (comment) (on CPM)   PT Visit Diagnosis: Unsteadiness on feet (R26.81);Muscle weakness (generalized) (M62.81);Difficulty in walking, not elsewhere classified (R26.2);Pain Pain - Right/Left: Right Pain - part of body: Knee;Leg    Time: 7829-5621 PT Time Calculation (min) (ACUTE ONLY): 25 min   Charges:   PT Evaluation $PT Eval Low Complexity: 1 Low PT Treatments $Gait Training: 8-22 mins PT General Charges $$ ACUTE PT VISIT: 1 Visit         Johnny Bridge, PT Acute Rehab   Jacqualyn Posey 05/27/2023, 6:26 PM

## 2023-05-27 NOTE — Transfer of Care (Signed)
 Immediate Anesthesia Transfer of Care Note  Patient: Randy [name]  Procedure(s) Performed: Procedure(s) with comments: TOTAL KNEE ARTHROPLASTY (Right) - and general , put in flip room  Patient Location: PACU  Anesthesia Type:Spinal  Level of Consciousness:  sedated, patient cooperative and responds to stimulation  Airway & Oxygen Therapy:Patient Spontanous Breathing and Patient connected to face mask oxgen  Post-op Assessment:  Report given to PACU RN and Post -op Vital signs reviewed and stable  Post vital signs:  Reviewed and stable  Last Vitals:  Vitals:   05/27/23 0925 05/27/23 0930  BP: 125/72 118/74  Pulse: 62 66  Resp: 13 14  Temp:    SpO2: 97% 96%    Complications: No apparent anesthesia complications

## 2023-05-27 NOTE — Progress Notes (Signed)
 Orthopedic Tech Progress Note Patient Details:  Randy [name] 21-Apr-1945 [id number] CPM will be removed at 2030 CPM Right Knee CPM Right Knee: On Right Knee Flexion (Degrees): 90 Right Knee Extension (Degrees): 0  Post Interventions Patient Tolerated: Well Ortho Devices Type of Ortho Device: Bone foam zero knee Ortho Device/Splint Location: Right knee Ortho Device/Splint Interventions: Application   Post Interventions Patient Tolerated: Well  Genelle Bal Emmaly Leech 05/27/2023, 4:42 PM

## 2023-05-27 NOTE — Care Plan (Signed)
 Ortho Bundle Case Management Note  Patient Details  Name: Randy [name] MRN: [id number] Date of Birth: 1946/03/09                  R TKA on 05/27/23.  DCP: Home with wife, daughter, and two grandsons.  DME: RW ordered through [employer].  PT: EO   DME Arranged:  Walker rolling DME Agency:  [employer]    Additional Comments: Please contact me with any questions of if this plan should need to change.   Ennis Forts, RN,CCM [facility]  [phone] 05/27/2023, 3:13 PM

## 2023-05-27 NOTE — Discharge Instructions (Signed)
 Ice to the knee constantly.  Keep the incision covered and clean and dry for one week, then ok to get it wet in the shower.  Do exercise as instructed every hour, please to prevent stiffness.    DO NOT prop anything under the knee, it will make your knee stiff.  Prop under the ankle to encourage your knee to go straight.   Use the walker while you are up and around for balance.  Wear your support stockings 24/7 to prevent blood clots and take baby aspirin twice daily for 30 days also to prevent blood clots  Follow up with Dr Ranell Patrick in two weeks in the office, call [phone] for appt  Please call Dr Haakon Titsworth(cell) [phone] with any questions or concerns  INSTRUCTIONS AFTER JOINT REPLACEMENT   Remove items at home which could result in a fall. This includes throw rugs or furniture in walking pathways ICE to the affected joint every three hours while awake for 30 minutes at a time, for at least the first 3-5 days, and then as needed for pain and swelling.  Continue to use ice for pain and swelling. You may notice swelling that will progress down to the foot and ankle.  This is normal after surgery.  Elevate your leg when you are not up walking on it.   Continue to use the breathing machine you got in the hospital (incentive spirometer) which will help keep your temperature down.  It is common for your temperature to cycle up and down following surgery, especially at night when you are not up moving around and exerting yourself.  The breathing machine keeps your lungs expanded and your temperature down.   DIET:  As you were doing prior to hospitalization, we recommend a well-balanced diet.  DRESSING / WOUND CARE / SHOWERING  You may change your dressing 3-5 days after surgery.  Then change the dressing every day with sterile gauze.  Please use good hand washing techniques before changing the dressing.  Do not use any lotions or creams on the incision until instructed by your  surgeon.  ACTIVITY  Increase activity slowly as tolerated, but follow the weight bearing instructions below.   No driving for 6 weeks or until further direction given by your physician.  You cannot drive while taking narcotics.  No lifting or carrying greater than 10 lbs. until further directed by your surgeon. Avoid periods of inactivity such as sitting longer than an hour when not asleep. This helps prevent blood clots.  You may return to work once you are authorized by your doctor.     WEIGHT BEARING   Weight bearing as tolerated with assist device (walker, cane, etc) as directed, use it as long as suggested by your surgeon or therapist, typically at least 4-6 weeks.   EXERCISES  Results after joint replacement surgery are often greatly improved when you follow the exercise, range of motion and muscle strengthening exercises prescribed by your doctor. Safety measures are also important to protect the joint from further injury. Any time any of these exercises cause you to have increased pain or swelling, decrease what you are doing until you are comfortable again and then slowly increase them. If you have problems or questions, call your caregiver or physical therapist for advice.   Rehabilitation is important following a joint replacement. After just a few days of immobilization, the muscles of the leg can become weakened and shrink (atrophy).  These exercises are designed to build up the  tone and strength of the thigh and leg muscles and to improve motion. Often times heat used for twenty to thirty minutes before working out will loosen up your tissues and help with improving the range of motion but do not use heat for the first two weeks following surgery (sometimes heat can increase post-operative swelling).   These exercises can be done on a training (exercise) mat, on the floor, on a table or on a bed. Use whatever works the best and is most comfortable for you.    Use music or  television while you are exercising so that the exercises are a pleasant break in your day. This will make your life better with the exercises acting as a break in your routine that you can look forward to.   Perform all exercises about fifteen times, three times per day or as directed.  You should exercise both the operative leg and the other leg as well.  Exercises include:   Quad Sets - Tighten up the muscle on the front of the thigh (Quad) and hold for 5-10 seconds.   Straight Leg Raises - With your knee straight (if you were given a brace, keep it on), lift the leg to 60 degrees, hold for 3 seconds, and slowly lower the leg.  Perform this exercise against resistance later as your leg gets stronger.  Leg Slides: Lying on your back, slowly slide your foot toward your buttocks, bending your knee up off the floor (only go as far as is comfortable). Then slowly slide your foot back down until your leg is flat on the floor again.  Angel Wings: Lying on your back spread your legs to the side as far apart as you can without causing discomfort.  Hamstring Strength:  Lying on your back, push your heel against the floor with your leg straight by tightening up the muscles of your buttocks.  Repeat, but this time bend your knee to a comfortable angle, and push your heel against the floor.  You may put a pillow under the heel to make it more comfortable if necessary.   A rehabilitation program following joint replacement surgery can speed recovery and prevent re-injury in the future due to weakened muscles. Contact your doctor or a physical therapist for more information on knee rehabilitation.    CONSTIPATION  Constipation is defined medically as fewer than three stools per week and severe constipation as less than one stool per week.  Even if you have a regular bowel pattern at home, your normal regimen is likely to be disrupted due to multiple reasons following surgery.  Combination of anesthesia,  postoperative narcotics, change in appetite and fluid intake all can affect your bowels.   YOU MUST use at least one of the following options; they are listed in order of increasing strength to get the job done.  They are all available over the counter, and you may need to use some, POSSIBLY even all of these options:    Drink plenty of fluids (prune juice may be helpful) and high fiber foods Colace 100 mg by mouth twice a day  Senokot for constipation as directed and as needed Dulcolax (bisacodyl), take with full glass of water  Miralax (polyethylene glycol) once or twice a day as needed.  If you have tried all these things and are unable to have a bowel movement in the first 3-4 days after surgery call either your surgeon or your primary doctor.    If you experience loose  stools or diarrhea, hold the medications until you stool forms back up.  If your symptoms do not get better within 1 week or if they get worse, check with your doctor.  If you experience "the worst abdominal pain ever" or develop nausea or vomiting, please contact the office immediately for further recommendations for treatment.   ITCHING:  If you experience itching with your medications, try taking only a single pain pill, or even half a pain pill at a time.  You can also use Benadryl over the counter for itching or also to help with sleep.   TED HOSE STOCKINGS:  Use stockings on both legs until for at least 2 weeks or as directed by physician office. They may be removed at night for sleeping.  MEDICATIONS:  See your medication summary on the "After Visit Summary" that nursing will review with you.  You may have some home medications which will be placed on hold until you complete the course of blood thinner medication.  It is important for you to complete the blood thinner medication as prescribed.  PRECAUTIONS:  If you experience chest pain or shortness of breath - call 911 immediately for transfer to the hospital emergency  department.   If you develop a fever greater that 101 F, purulent drainage from wound, increased redness or drainage from wound, foul odor from the wound/dressing, or calf pain - CONTACT YOUR SURGEON.                                                   FOLLOW-UP APPOINTMENTS:  If you do not already have a post-op appointment, please call the office for an appointment to be seen by your surgeon.  Guidelines for how soon to be seen are listed in your "After Visit Summary", but are typically between 1-4 weeks after surgery.  OTHER INSTRUCTIONS:   Knee Replacement:  Do not place pillow under knee, focus on keeping the knee straight while resting. CPM instructions: 0-90 degrees, 2 hours in the morning, 2 hours in the afternoon, and 2 hours in the evening. Place foam block, curve side up under heel at all times except when in CPM or when walking.  DO NOT modify, tear, cut, or change the foam block in any way.  POST-OPERATIVE OPIOID TAPER INSTRUCTIONS: It is important to wean off of your opioid medication as soon as possible. If you do not need pain medication after your surgery it is ok to stop day one. Opioids include: Codeine, Hydrocodone(Norco, Vicodin), Oxycodone(Percocet, oxycontin) and hydromorphone amongst others.  Long term and even short term use of opiods can cause: Increased pain response Dependence Constipation Depression Respiratory depression And more.  Withdrawal symptoms can include Flu like symptoms Nausea, vomiting And more Techniques to manage these symptoms Hydrate well Eat regular healthy meals Stay active Use relaxation techniques(deep breathing, meditating, yoga) Do Not substitute Alcohol to help with tapering If you have been on opioids for less than two weeks and do not have pain than it is ok to stop all together.  Plan to wean off of opioids This plan should start within one week post op of your joint replacement. Maintain the same interval or time between taking  each dose and first decrease the dose.  Cut the total daily intake of opioids by one tablet each day Next start to increase the  time between doses. The last dose that should be eliminated is the evening dose.   MAKE SURE YOU:  Understand these instructions.  Get help right away if you are not doing well or get worse.    Thank you for letting us be a part of your medical care team.  It is a privilege we respect greatly.  We hope these instructions will help you stay on track for a fast and full recovery!

## 2023-05-28 DIAGNOSIS — M1711 Unilateral primary osteoarthritis, right knee: Secondary | ICD-10-CM | POA: Diagnosis not present

## 2023-05-28 NOTE — Plan of Care (Signed)
   Problem: Education: Goal: Knowledge of General Education information will improve Description Including pain rating scale, medication(s)/side effects and non-pharmacologic comfort measures Outcome: Progressing

## 2023-05-28 NOTE — Progress Notes (Signed)
 Patient ID: Randy [name], male   DOB: Feb 09, 1946, 78 y.o.   MRN: [id number] Subjective: 1 Day Post-Op Procedure(s) (LRB): TOTAL KNEE ARTHROPLASTY (Right)    Patient reports pain as mild at this point, no events overnight  Objective:   VITALS:   Vitals:   05/28/23 0131 05/28/23 0621  BP: 119/80 (!) 108/57  Pulse: 71 70  Resp: 16 17  Temp: 97.9 F (36.6 C) 98.7 F (37.1 C)  SpO2: 97% 97%    Neurovascular intact - able to perform SLR easily this morning Incision: dressing C/D/I - right knee post op dressing removed and Aquacel applied  LABS No results for input(s): "HGB", "HCT", "WBC", "PLT" in the last 72 hours.  No results for input(s): "NA", "K", "BUN", "CREATININE", "GLUCOSE" in the last 72 hours.  No results for input(s): "LABPT", "INR" in the last 72 hours.   Assessment/Plan: 1 Day Post-Op Procedure(s) (LRB): TOTAL KNEE ARTHROPLASTY (Right)   Advance diet Up with therapy Reviewed goals Likely home today after therapy RTC in 2 weeks to see Ranell Patrick Rx to be sent to pharmacy

## 2023-05-28 NOTE — Plan of Care (Signed)

## 2023-05-28 NOTE — TOC Transition Note (Signed)
 Transition of Care [facility]) - Discharge Note  Patient Details  Name: Randy [name] MRN: [id number] Date of Birth: 01-24-1946  [facility] CM/SW Contact:  Ewing Schlein, LCSW Phone Number: 05/28/2023, 11:53 AM  Clinical Narrative: CSW met with patient and family to confirm discharge plan. Patient will go home with OPPT at [facility]. Patient received a rolling walker from [employer] yesterday, which is in his room. TOC signing off.  Final next level of care: OP Rehab Barriers to Discharge: No Barriers Identified  Patient Goals and CMS Choice Patient states their goals for this hospitalization and ongoing recovery are:: Discharge home with OPPT at [facility] CMS Medicare.gov Compare Post Acute Care list provided to:: Patient Choice offered to / list presented to : Patient  Discharge Plan and Services Additional resources added to the After Visit Summary for      DME Arranged: Walker rolling DME Agency: [employer] Representative spoke with at DME Agency: Prearranged in orthopedist's office  Social Drivers of Health (SDOH) Interventions SDOH Screenings   Food Insecurity: No Food Insecurity (05/27/2023)  Housing: Low Risk  (05/27/2023)  Transportation Needs: No Transportation Needs (05/27/2023)  Utilities: Not At Risk (05/27/2023)  Social Connections: Patient Declined (05/27/2023)  Tobacco Use: Medium Risk (05/27/2023)   Readmission Risk Interventions     No data to display

## 2023-05-28 NOTE — Progress Notes (Signed)
 Reviewed written d/c instructions w pt and familyu\, all questions answered and they verbalized understanding. D/C via w/c w all belongings in stable condition.

## 2023-05-28 NOTE — Discharge Summary (Signed)
 In most cases prophylactic antibiotics for Dental procdeures after total joint surgery are not necessary.  Exceptions are as follows:  1. History of prior total joint infection  2. Severely immunocompromised (Organ Transplant, cancer chemotherapy, Rheumatoid biologic meds such as Humera)  3. Poorly controlled diabetes (A1C &gt; 8.0, blood glucose over 200)  If you have one of these conditions, contact your surgeon for an antibiotic prescription, prior to your dental procedure. Orthopedic Discharge Summary        Physician Discharge Summary  Patient ID: Randy [name] MRN: [id number] DOB/AGE: [birth year] 78 y.o.  Admit date: 05/27/2023 Discharge date: 05/28/2023   Procedures:  Procedure(s) (LRB): TOTAL KNEE ARTHROPLASTY (Right)  Attending Physician:  Dr. Malon Kindle  Admission Diagnoses:   Right knee end stage OA  Discharge Diagnoses:  same   Past Medical History:  Diagnosis Date   Anemia    Anxiety    Arthritis    "hands, knees, shoulders" (06/24/2014)   CAD (coronary artery disease)    a. 11/2011 Cath: LM 20 ost, LAD 40 ost, 30-40p, 60-89m, D1 mild plaquing, LCX 70-80 p-m (FFR 0.78, treated with  3.0x20 and 2.75x16 Promus DES), RCA 30-69m, EF 55%, no rwma.;  b.  ETT-Myoview (06/2013):  Ex 7:00, no ST changes, soft tissue atten, no scar or ischemia, EF 62%;  06/2014 MV: EF 56%, no isch/infarct.   Cancer (HCC)    basal cell lesion on forehead   Complication of anesthesia    Takes a long time to wake up   H/O echocardiogram    a. 06/2014 Echo: EF 55-60%, Gr 1 DD.   Heart murmur    ECHO done 12-09-2022   Hyperlipidemia    Hypertension    Pre-diabetes     PCP: Jackelyn Poling, DO   Discharged Condition: good  Hospital Course:  Patient underwent the above stated procedure on 05/27/2023. Patient tolerated the procedure well and brought to the recovery room in good condition and subsequently to the floor. Patient had an uncomplicated hospital course and was stable  for discharge.   Disposition: Discharge disposition: 01-Home or Self Care      with follow up in 2 weeks    Follow-up Information     Beverely Low, MD. Go on 06/08/2023.   Specialty: Orthopedic Surgery Why: You are scheduled for first post op appt on Wednesday March 5 at 11:00am. Contact information: [address] [place] [address] [place] [address] [phone]                 Dental Antibiotics:  In most cases prophylactic antibiotics for Dental procdeures after total joint surgery are not necessary.  Exceptions are as follows:  1. History of prior total joint infection  2. Severely immunocompromised (Organ Transplant, cancer chemotherapy, Rheumatoid biologic meds such as Humera)  3. Poorly controlled diabetes (A1C &gt; 8.0, blood glucose over 200)  If you have one of these conditions, contact your surgeon for an antibiotic prescription, prior to your dental procedure.  Discharge Instructions     Call MD / Call 911   Complete by: As directed    If you experience chest pain or shortness of breath, CALL 911 and be transported to the hospital emergency room.  If you develope a fever above 101 F, pus (white drainage) or increased drainage or redness at the wound, or calf pain, call your surgeon's office.   Change dressing   Complete by: As directed    Maintain surgical dressing until follow up in  the clinic. If the edges start to pull up, may reinforce with tape. If the dressing is no longer working, may remove and cover with gauze and tape, but must keep the area dry and clean.  Call with any questions or concerns.   Constipation Prevention   Complete by: As directed    Drink plenty of fluids.  Prune juice may be helpful.  You may use a stool softener, such as Colace (over the counter) 100 mg twice a day.  Use MiraLax (over the counter) for constipation as needed.   Diet - low sodium heart healthy   Complete by: As directed    Increase activity slowly as  tolerated   Complete by: As directed    Weight bearing as tolerated with assist device (walker, cane, etc) as directed, use it as long as suggested by your surgeon or therapist, typically at least 4-6 weeks.   Post-operative opioid taper instructions:   Complete by: As directed    POST-OPERATIVE OPIOID TAPER INSTRUCTIONS: It is important to wean off of your opioid medication as soon as possible. If you do not need pain medication after your surgery it is ok to stop day one. Opioids include: Codeine, Hydrocodone(Norco, Vicodin), Oxycodone(Percocet, oxycontin) and hydromorphone amongst others.  Long term and even short term use of opiods can cause: Increased pain response Dependence Constipation Depression Respiratory depression And more.  Withdrawal symptoms can include Flu like symptoms Nausea, vomiting And more Techniques to manage these symptoms Hydrate well Eat regular healthy meals Stay active Use relaxation techniques(deep breathing, meditating, yoga) Do Not substitute Alcohol to help with tapering If you have been on opioids for less than two weeks and do not have pain than it is ok to stop all together.  Plan to wean off of opioids This plan should start within one week post op of your joint replacement. Maintain the same interval or time between taking each dose and first decrease the dose.  Cut the total daily intake of opioids by one tablet each day Next start to increase the time between doses. The last dose that should be eliminated is the evening dose.      TED hose   Complete by: As directed    Use stockings (TED hose) for 2 weeks on both leg(s).  You may remove them at night for sleeping.       Allergies as of 05/28/2023       Reactions   Other Other (See Comments)   Anticoagulant when giving plasma        Medication List     TAKE these medications    acetaminophen 650 MG CR tablet Commonly known as: TYLENOL Take 1,300 mg by mouth every 8  (eight) hours as needed for pain.   aspirin EC 81 MG tablet Take 1 tablet (81 mg total) by mouth in the morning and at bedtime. What changed: when to take this   atorvastatin 40 MG tablet Commonly known as: LIPITOR Take 1 tablet (40 mg total) by mouth daily at 6 PM.   benazepril 40 MG tablet Commonly known as: LOTENSIN Take 40 mg by mouth 2 (two) times daily.   CALCIUM CITRATE PO Take 600 mg by mouth daily.   CO Q 10 PO Take 300 mg by mouth daily.   diphenhydrAMINE 25 mg capsule Commonly known as: BENADRYL Take 25 mg by mouth every 6 (six) hours as needed for allergies.   hydrochlorothiazide 25 MG tablet Commonly known as: HYDRODIURIL Take 25 mg  by mouth every morning.   loratadine 10 MG tablet Commonly known as: CLARITIN Take 10 mg by mouth daily as needed for allergies.   melatonin 5 MG Tabs Take 5 mg by mouth at bedtime.   methocarbamol 500 MG tablet Commonly known as: ROBAXIN Take 500-1,000 mg by mouth every 8 (eight) hours as needed for muscle spasms. What changed: Another medication with the same name was added. Make sure you understand how and when to take each.   methocarbamol 500 MG tablet Commonly known as: ROBAXIN Take 1 tablet (500 mg total) by mouth every 8 (eight) hours as needed for muscle spasms. What changed: You were already taking a medication with the same name, and this prescription was added. Make sure you understand how and when to take each.   metoprolol succinate 50 MG 24 hr tablet Commonly known as: TOPROL-XL Take 1 tablet (50 mg total) by mouth daily.   multivitamin with minerals Tabs tablet Take 1 tablet by mouth every morning.   nitroGLYCERIN 0.4 MG SL tablet Commonly known as: NITROSTAT Place 0.4 mg under the tongue every 5 (five) minutes as needed for chest pain.   ondansetron 4 MG tablet Commonly known as: Zofran Take 1 tablet (4 mg total) by mouth every 8 (eight) hours as needed for refractory nausea / vomiting, vomiting or  nausea.   OVER THE COUNTER MEDICATION Take 2 tablets by mouth 2 (two) times daily. Balance of nature fruit and vegtables   oxyCODONE-acetaminophen 5-325 MG tablet Commonly known as: Percocet Take 1 tablet by mouth every 4 (four) hours as needed for severe pain (pain score 7-10).   zolpidem 10 MG tablet Commonly known as: AMBIEN Take 10 mg by mouth at bedtime as needed for sleep.               Discharge Care Instructions  (From admission, onward)           Start     Ordered   05/28/23 0000  Change dressing       Comments: Maintain surgical dressing until follow up in the clinic. If the edges start to pull up, may reinforce with tape. If the dressing is no longer working, may remove and cover with gauze and tape, but must keep the area dry and clean.  Call with any questions or concerns.   05/28/23 0749              Signed: Verlee Rossetti 05/28/2023, 9:08 AM  [facility] is now [facility] [address], [address], [place], [place] [address] Phone: [phone] Facebook  Instagram  [employer]

## 2023-05-28 NOTE — Care Management Obs Status (Signed)
 MEDICARE OBSERVATION STATUS NOTIFICATION   Patient Details  Name: Randy [name] MRN: [id number] Date of Birth: 01/05/1946   Medicare Observation Status Notification Given:  Yes    Ewing Schlein, LCSW 05/28/2023, 12:01 PM

## 2023-05-28 NOTE — Progress Notes (Signed)
 Physical Therapy Treatment Patient Details Name: Randy [name] MRN: [id number] DOB: 06-May-1945 Today's Date: 05/28/2023   History of Present Illness 78 yo male presents to therapy s/p R TKA on 05/27/2023 due to failure of conservative measures. Pt PMH includes but is not limited to: anemia, anxiety, CAD s/p cath, HLD, and HTN.    PT Comments  Pt agreeable to working with therapy. He continues to progress well. He reports surgeon advised him to avoid stairs for first week so we did not practice stair negotiation (no stairs to enter and can stay on 1st level). Encouraged pt to ambulate often at home, as tolerated. He politely declined a HEP handout since exercises are already in his d/c summary (per pt). OP PT f/u.  All PT education completed.    If plan is discharge home, recommend the following: A little help with walking and/or transfers;A little help with bathing/dressing/bathroom;Assistance with cooking/housework;Assist for transportation;Help with stairs or ramp for entrance   Can travel by private vehicle        Equipment Recommendations       Recommendations for Other Services       Precautions / Restrictions Precautions Precautions: Fall;Knee Restrictions Weight Bearing Restrictions Per Provider Order: No Other Position/Activity Restrictions: WBAT     Mobility  Bed Mobility Overal bed mobility: Needs Assistance Bed Mobility: Supine to Sit, Sit to Supine     Supine to sit: Supervision, HOB elevated Sit to supine: Supervision, HOB elevated        Transfers Overall transfer level: Needs assistance Equipment used: Rolling walker (2 wheels) Transfers: Sit to/from Stand Sit to Stand: Supervision                Ambulation/Gait Ambulation/Gait assistance: Supervision Gait Distance (Feet): 115 Feet Assistive device: Rolling walker (2 wheels) Gait Pattern/deviations: Step-through pattern, Decreased stride length, Trunk flexed       General Gait Details: cues  for safety, posture. pt used reciprocal gait pattern almost immediately. no lob with RW use. pt denied dizziness. tolerated distance well.   Stairs             Wheelchair Mobility     Tilt Bed    Modified Rankin (Stroke Patients Only)       Balance Overall balance assessment: Needs assistance         Standing balance support: Bilateral upper extremity supported, During functional activity, Reliant on assistive device for balance Standing balance-Leahy Scale: Fair                              Hotel manager: No apparent difficulties  Cognition Arousal: Alert Behavior During Therapy: WFL for tasks assessed/performed   PT - Cognitive impairments: No apparent impairments                         Following commands: Intact      Cueing    Exercises Total Joint Exercises Ankle Circles/Pumps: AROM, Both, 10 reps Quad Sets: AROM, Right, 10 reps Straight Leg Raises: AROM, Right, 10 reps Knee Flexion: AROM, Right, 10 reps, Seated Goniometric ROM: ~10-65 degrees    General Comments        Pertinent Vitals/Pain Pain Assessment Pain Assessment: Faces Faces Pain Scale: Hurts little more Pain Location: R knee Pain Descriptors / Indicators: Discomfort, Aching Pain Intervention(s):  (pt declined ice)    Home Living  Prior Function            PT Goals (current goals can now be found in the care plan section) Progress towards PT goals: Progressing toward goals    Frequency    7X/week      PT Plan      Co-evaluation              AM-PAC PT "6 Clicks" Mobility   Outcome Measure  Help needed turning from your back to your side while in a flat bed without using bedrails?: None Help needed moving from lying on your back to sitting on the side of a flat bed without using bedrails?: None Help needed moving to and from a bed to a chair (including a wheelchair)?: A  Little Help needed standing up from a chair using your arms (e.g., wheelchair or bedside chair)?: A Little Help needed to walk in hospital room?: A Little Help needed climbing 3-5 steps with a railing? : A Little 6 Click Score: 20    End of Session Equipment Utilized During Treatment: Gait belt Activity Tolerance: Patient tolerated treatment well Patient left: in bed;with call bell/phone within reach;with family/visitor present   PT Visit Diagnosis: Unsteadiness on feet (R26.81);Muscle weakness (generalized) (M62.81);Difficulty in walking, not elsewhere classified (R26.2);Pain Pain - Right/Left: Right Pain - part of body: Knee     Time: 1012-1028 PT Time Calculation (min) (ACUTE ONLY): 16 min  Charges:    $Gait Training: 8-22 mins PT General Charges $$ ACUTE PT VISIT: 1 Visit                        Faye Ramsay, PT Acute Rehabilitation  Office: [phone]

## 2023-05-30 ENCOUNTER — Encounter (HOSPITAL_COMMUNITY): Payer: Self-pay | Admitting: Orthopedic Surgery

## 2023-05-30 DIAGNOSIS — M25561 Pain in right knee: Secondary | ICD-10-CM | POA: Diagnosis not present

## 2023-05-30 DIAGNOSIS — M25661 Stiffness of right knee, not elsewhere classified: Secondary | ICD-10-CM | POA: Diagnosis not present

## 2023-06-01 DIAGNOSIS — M25561 Pain in right knee: Secondary | ICD-10-CM | POA: Diagnosis not present

## 2023-06-01 DIAGNOSIS — M25661 Stiffness of right knee, not elsewhere classified: Secondary | ICD-10-CM | POA: Diagnosis not present

## 2023-06-03 DIAGNOSIS — M25561 Pain in right knee: Secondary | ICD-10-CM | POA: Diagnosis not present

## 2023-06-03 DIAGNOSIS — M25661 Stiffness of right knee, not elsewhere classified: Secondary | ICD-10-CM | POA: Diagnosis not present

## 2023-06-06 DIAGNOSIS — M25661 Stiffness of right knee, not elsewhere classified: Secondary | ICD-10-CM | POA: Diagnosis not present

## 2023-06-06 DIAGNOSIS — M25561 Pain in right knee: Secondary | ICD-10-CM | POA: Diagnosis not present

## 2023-06-08 DIAGNOSIS — M25561 Pain in right knee: Secondary | ICD-10-CM | POA: Diagnosis not present

## 2023-06-08 DIAGNOSIS — Z4789 Encounter for other orthopedic aftercare: Secondary | ICD-10-CM | POA: Diagnosis not present

## 2023-06-08 DIAGNOSIS — M25661 Stiffness of right knee, not elsewhere classified: Secondary | ICD-10-CM | POA: Diagnosis not present

## 2023-06-09 DIAGNOSIS — Z09 Encounter for follow-up examination after completed treatment for conditions other than malignant neoplasm: Secondary | ICD-10-CM | POA: Diagnosis not present

## 2023-06-09 DIAGNOSIS — M25861 Other specified joint disorders, right knee: Secondary | ICD-10-CM | POA: Diagnosis not present

## 2023-06-09 DIAGNOSIS — Z96651 Presence of right artificial knee joint: Secondary | ICD-10-CM | POA: Diagnosis not present

## 2023-06-10 DIAGNOSIS — M25561 Pain in right knee: Secondary | ICD-10-CM | POA: Diagnosis not present

## 2023-06-10 DIAGNOSIS — M25661 Stiffness of right knee, not elsewhere classified: Secondary | ICD-10-CM | POA: Diagnosis not present

## 2023-06-13 DIAGNOSIS — M25661 Stiffness of right knee, not elsewhere classified: Secondary | ICD-10-CM | POA: Diagnosis not present

## 2023-06-13 DIAGNOSIS — M25561 Pain in right knee: Secondary | ICD-10-CM | POA: Diagnosis not present

## 2023-06-15 DIAGNOSIS — M25561 Pain in right knee: Secondary | ICD-10-CM | POA: Diagnosis not present

## 2023-06-15 DIAGNOSIS — M25661 Stiffness of right knee, not elsewhere classified: Secondary | ICD-10-CM | POA: Diagnosis not present

## 2023-06-20 DIAGNOSIS — M25561 Pain in right knee: Secondary | ICD-10-CM | POA: Diagnosis not present

## 2023-06-20 DIAGNOSIS — M25661 Stiffness of right knee, not elsewhere classified: Secondary | ICD-10-CM | POA: Diagnosis not present

## 2023-06-22 DIAGNOSIS — M25661 Stiffness of right knee, not elsewhere classified: Secondary | ICD-10-CM | POA: Diagnosis not present

## 2023-06-22 DIAGNOSIS — M25561 Pain in right knee: Secondary | ICD-10-CM | POA: Diagnosis not present

## 2023-06-27 DIAGNOSIS — M25561 Pain in right knee: Secondary | ICD-10-CM | POA: Diagnosis not present

## 2023-06-27 DIAGNOSIS — M25661 Stiffness of right knee, not elsewhere classified: Secondary | ICD-10-CM | POA: Diagnosis not present

## 2023-06-29 DIAGNOSIS — M25661 Stiffness of right knee, not elsewhere classified: Secondary | ICD-10-CM | POA: Diagnosis not present

## 2023-06-29 DIAGNOSIS — M25561 Pain in right knee: Secondary | ICD-10-CM | POA: Diagnosis not present

## 2023-07-04 DIAGNOSIS — M25661 Stiffness of right knee, not elsewhere classified: Secondary | ICD-10-CM | POA: Diagnosis not present

## 2023-07-04 DIAGNOSIS — M25561 Pain in right knee: Secondary | ICD-10-CM | POA: Diagnosis not present

## 2023-07-06 DIAGNOSIS — M25561 Pain in right knee: Secondary | ICD-10-CM | POA: Diagnosis not present

## 2023-07-06 DIAGNOSIS — M25661 Stiffness of right knee, not elsewhere classified: Secondary | ICD-10-CM | POA: Diagnosis not present

## 2023-07-11 DIAGNOSIS — T8482XA Fibrosis due to internal orthopedic prosthetic devices, implants and grafts, initial encounter: Secondary | ICD-10-CM | POA: Diagnosis not present

## 2023-07-11 DIAGNOSIS — M24661 Ankylosis, right knee: Secondary | ICD-10-CM | POA: Diagnosis not present

## 2023-07-11 DIAGNOSIS — Z96651 Presence of right artificial knee joint: Secondary | ICD-10-CM | POA: Diagnosis not present

## 2023-07-12 DIAGNOSIS — M25561 Pain in right knee: Secondary | ICD-10-CM | POA: Diagnosis not present

## 2023-07-12 DIAGNOSIS — M25661 Stiffness of right knee, not elsewhere classified: Secondary | ICD-10-CM | POA: Diagnosis not present

## 2023-07-13 DIAGNOSIS — M25661 Stiffness of right knee, not elsewhere classified: Secondary | ICD-10-CM | POA: Diagnosis not present

## 2023-07-13 DIAGNOSIS — M25561 Pain in right knee: Secondary | ICD-10-CM | POA: Diagnosis not present

## 2023-07-15 DIAGNOSIS — M25561 Pain in right knee: Secondary | ICD-10-CM | POA: Diagnosis not present

## 2023-07-15 DIAGNOSIS — M25661 Stiffness of right knee, not elsewhere classified: Secondary | ICD-10-CM | POA: Diagnosis not present

## 2023-07-18 DIAGNOSIS — M25661 Stiffness of right knee, not elsewhere classified: Secondary | ICD-10-CM | POA: Diagnosis not present

## 2023-07-18 DIAGNOSIS — M25561 Pain in right knee: Secondary | ICD-10-CM | POA: Diagnosis not present

## 2023-07-19 DIAGNOSIS — M25561 Pain in right knee: Secondary | ICD-10-CM | POA: Diagnosis not present

## 2023-07-19 DIAGNOSIS — M25661 Stiffness of right knee, not elsewhere classified: Secondary | ICD-10-CM | POA: Diagnosis not present

## 2023-07-20 DIAGNOSIS — M25661 Stiffness of right knee, not elsewhere classified: Secondary | ICD-10-CM | POA: Diagnosis not present

## 2023-07-20 DIAGNOSIS — M25561 Pain in right knee: Secondary | ICD-10-CM | POA: Diagnosis not present

## 2023-07-21 DIAGNOSIS — M25561 Pain in right knee: Secondary | ICD-10-CM | POA: Diagnosis not present

## 2023-07-21 DIAGNOSIS — M25661 Stiffness of right knee, not elsewhere classified: Secondary | ICD-10-CM | POA: Diagnosis not present

## 2023-07-25 DIAGNOSIS — M25661 Stiffness of right knee, not elsewhere classified: Secondary | ICD-10-CM | POA: Diagnosis not present

## 2023-07-25 DIAGNOSIS — M25561 Pain in right knee: Secondary | ICD-10-CM | POA: Diagnosis not present

## 2023-07-27 DIAGNOSIS — M25661 Stiffness of right knee, not elsewhere classified: Secondary | ICD-10-CM | POA: Diagnosis not present

## 2023-07-27 DIAGNOSIS — M25561 Pain in right knee: Secondary | ICD-10-CM | POA: Diagnosis not present

## 2023-07-29 DIAGNOSIS — M25561 Pain in right knee: Secondary | ICD-10-CM | POA: Diagnosis not present

## 2023-07-29 DIAGNOSIS — M25661 Stiffness of right knee, not elsewhere classified: Secondary | ICD-10-CM | POA: Diagnosis not present

## 2023-08-01 DIAGNOSIS — M25661 Stiffness of right knee, not elsewhere classified: Secondary | ICD-10-CM | POA: Diagnosis not present

## 2023-08-01 DIAGNOSIS — M25561 Pain in right knee: Secondary | ICD-10-CM | POA: Diagnosis not present

## 2023-08-03 DIAGNOSIS — M25661 Stiffness of right knee, not elsewhere classified: Secondary | ICD-10-CM | POA: Diagnosis not present

## 2023-08-03 DIAGNOSIS — M25561 Pain in right knee: Secondary | ICD-10-CM | POA: Diagnosis not present

## 2023-08-05 DIAGNOSIS — M25661 Stiffness of right knee, not elsewhere classified: Secondary | ICD-10-CM | POA: Diagnosis not present

## 2023-08-05 DIAGNOSIS — Z96651 Presence of right artificial knee joint: Secondary | ICD-10-CM | POA: Diagnosis not present

## 2023-08-05 DIAGNOSIS — M25561 Pain in right knee: Secondary | ICD-10-CM | POA: Diagnosis not present

## 2023-08-09 DIAGNOSIS — M25661 Stiffness of right knee, not elsewhere classified: Secondary | ICD-10-CM | POA: Diagnosis not present

## 2023-08-09 DIAGNOSIS — M25561 Pain in right knee: Secondary | ICD-10-CM | POA: Diagnosis not present

## 2023-08-11 DIAGNOSIS — M25661 Stiffness of right knee, not elsewhere classified: Secondary | ICD-10-CM | POA: Diagnosis not present

## 2023-08-11 DIAGNOSIS — M25561 Pain in right knee: Secondary | ICD-10-CM | POA: Diagnosis not present

## 2023-08-16 DIAGNOSIS — M25561 Pain in right knee: Secondary | ICD-10-CM | POA: Diagnosis not present

## 2023-08-16 DIAGNOSIS — M25661 Stiffness of right knee, not elsewhere classified: Secondary | ICD-10-CM | POA: Diagnosis not present

## 2023-08-17 DIAGNOSIS — M25511 Pain in right shoulder: Secondary | ICD-10-CM | POA: Diagnosis not present

## 2023-08-17 DIAGNOSIS — G8929 Other chronic pain: Secondary | ICD-10-CM | POA: Diagnosis not present

## 2023-08-17 DIAGNOSIS — M25562 Pain in left knee: Secondary | ICD-10-CM | POA: Diagnosis not present

## 2023-08-17 DIAGNOSIS — Z4889 Encounter for other specified surgical aftercare: Secondary | ICD-10-CM | POA: Diagnosis not present

## 2023-08-17 DIAGNOSIS — M1712 Unilateral primary osteoarthritis, left knee: Secondary | ICD-10-CM | POA: Diagnosis not present

## 2023-08-17 DIAGNOSIS — M25512 Pain in left shoulder: Secondary | ICD-10-CM | POA: Diagnosis not present

## 2023-08-18 DIAGNOSIS — M25561 Pain in right knee: Secondary | ICD-10-CM | POA: Diagnosis not present

## 2023-08-18 DIAGNOSIS — M25661 Stiffness of right knee, not elsewhere classified: Secondary | ICD-10-CM | POA: Diagnosis not present

## 2023-08-25 DIAGNOSIS — M25561 Pain in right knee: Secondary | ICD-10-CM | POA: Diagnosis not present

## 2023-08-25 DIAGNOSIS — M25661 Stiffness of right knee, not elsewhere classified: Secondary | ICD-10-CM | POA: Diagnosis not present

## 2023-08-30 DIAGNOSIS — M25561 Pain in right knee: Secondary | ICD-10-CM | POA: Diagnosis not present

## 2023-08-30 DIAGNOSIS — M25661 Stiffness of right knee, not elsewhere classified: Secondary | ICD-10-CM | POA: Diagnosis not present

## 2023-09-01 DIAGNOSIS — M25661 Stiffness of right knee, not elsewhere classified: Secondary | ICD-10-CM | POA: Diagnosis not present

## 2023-09-01 DIAGNOSIS — M25561 Pain in right knee: Secondary | ICD-10-CM | POA: Diagnosis not present

## 2023-09-05 DIAGNOSIS — M25561 Pain in right knee: Secondary | ICD-10-CM | POA: Diagnosis not present

## 2023-09-05 DIAGNOSIS — Z96651 Presence of right artificial knee joint: Secondary | ICD-10-CM | POA: Diagnosis not present

## 2023-09-05 DIAGNOSIS — M25661 Stiffness of right knee, not elsewhere classified: Secondary | ICD-10-CM | POA: Diagnosis not present

## 2023-09-08 DIAGNOSIS — M25661 Stiffness of right knee, not elsewhere classified: Secondary | ICD-10-CM | POA: Diagnosis not present

## 2023-09-08 DIAGNOSIS — M25561 Pain in right knee: Secondary | ICD-10-CM | POA: Diagnosis not present

## 2023-09-12 DIAGNOSIS — M25561 Pain in right knee: Secondary | ICD-10-CM | POA: Diagnosis not present

## 2023-09-12 DIAGNOSIS — M25661 Stiffness of right knee, not elsewhere classified: Secondary | ICD-10-CM | POA: Diagnosis not present

## 2023-09-15 DIAGNOSIS — M25661 Stiffness of right knee, not elsewhere classified: Secondary | ICD-10-CM | POA: Diagnosis not present

## 2023-09-15 DIAGNOSIS — M25561 Pain in right knee: Secondary | ICD-10-CM | POA: Diagnosis not present

## 2023-09-20 DIAGNOSIS — M25661 Stiffness of right knee, not elsewhere classified: Secondary | ICD-10-CM | POA: Diagnosis not present

## 2023-09-20 DIAGNOSIS — M25561 Pain in right knee: Secondary | ICD-10-CM | POA: Diagnosis not present

## 2023-09-23 DIAGNOSIS — M25661 Stiffness of right knee, not elsewhere classified: Secondary | ICD-10-CM | POA: Diagnosis not present

## 2023-09-23 DIAGNOSIS — M25561 Pain in right knee: Secondary | ICD-10-CM | POA: Diagnosis not present

## 2023-09-27 DIAGNOSIS — M25512 Pain in left shoulder: Secondary | ICD-10-CM | POA: Diagnosis not present

## 2023-09-27 DIAGNOSIS — Z96651 Presence of right artificial knee joint: Secondary | ICD-10-CM | POA: Diagnosis not present

## 2023-09-27 DIAGNOSIS — M25511 Pain in right shoulder: Secondary | ICD-10-CM | POA: Diagnosis not present

## 2023-10-05 DIAGNOSIS — Z96651 Presence of right artificial knee joint: Secondary | ICD-10-CM | POA: Diagnosis not present

## 2023-10-05 DIAGNOSIS — M25661 Stiffness of right knee, not elsewhere classified: Secondary | ICD-10-CM | POA: Diagnosis not present

## 2023-11-17 DIAGNOSIS — Z961 Presence of intraocular lens: Secondary | ICD-10-CM | POA: Diagnosis not present

## 2023-11-17 DIAGNOSIS — H0102B Squamous blepharitis left eye, upper and lower eyelids: Secondary | ICD-10-CM | POA: Diagnosis not present

## 2023-11-17 DIAGNOSIS — H43812 Vitreous degeneration, left eye: Secondary | ICD-10-CM | POA: Diagnosis not present

## 2023-11-17 DIAGNOSIS — D3131 Benign neoplasm of right choroid: Secondary | ICD-10-CM | POA: Diagnosis not present

## 2023-11-17 DIAGNOSIS — H0102A Squamous blepharitis right eye, upper and lower eyelids: Secondary | ICD-10-CM | POA: Diagnosis not present

## 2023-11-17 DIAGNOSIS — H04123 Dry eye syndrome of bilateral lacrimal glands: Secondary | ICD-10-CM | POA: Diagnosis not present

## 2023-11-18 DIAGNOSIS — G4700 Insomnia, unspecified: Secondary | ICD-10-CM | POA: Diagnosis not present

## 2023-11-18 DIAGNOSIS — E7800 Pure hypercholesterolemia, unspecified: Secondary | ICD-10-CM | POA: Diagnosis not present

## 2023-11-18 DIAGNOSIS — I10 Essential (primary) hypertension: Secondary | ICD-10-CM | POA: Diagnosis not present

## 2023-11-18 DIAGNOSIS — D649 Anemia, unspecified: Secondary | ICD-10-CM | POA: Diagnosis not present

## 2023-12-08 ENCOUNTER — Ambulatory Visit (HOSPITAL_COMMUNITY)
Admission: RE | Admit: 2023-12-08 | Discharge: 2023-12-08 | Disposition: A | Discharge status: Home / Self Care | Source: Ambulatory Visit | Attending: Cardiology | Admitting: Cardiology

## 2023-12-08 DIAGNOSIS — I10 Essential (primary) hypertension: Secondary | ICD-10-CM | POA: Insufficient documentation

## 2023-12-08 DIAGNOSIS — I77810 Thoracic aortic ectasia: Secondary | ICD-10-CM | POA: Insufficient documentation

## 2023-12-08 DIAGNOSIS — E785 Hyperlipidemia, unspecified: Secondary | ICD-10-CM | POA: Diagnosis not present

## 2023-12-08 DIAGNOSIS — R011 Cardiac murmur, unspecified: Secondary | ICD-10-CM | POA: Insufficient documentation

## 2023-12-08 DIAGNOSIS — I2510 Atherosclerotic heart disease of native coronary artery without angina pectoris: Secondary | ICD-10-CM | POA: Diagnosis not present

## 2023-12-08 DIAGNOSIS — I358 Other nonrheumatic aortic valve disorders: Secondary | ICD-10-CM | POA: Diagnosis not present

## 2023-12-08 LAB — ECHOCARDIOGRAM COMPLETE
Area-P 1/2: 2.87 cm2
S' Lateral: 2.5 cm

## 2023-12-09 ENCOUNTER — Encounter: Payer: Self-pay | Admitting: Physician Assistant

## 2023-12-09 ENCOUNTER — Ambulatory Visit: Payer: Self-pay | Admitting: Physician Assistant

## 2023-12-09 DIAGNOSIS — I359 Nonrheumatic aortic valve disorder, unspecified: Secondary | ICD-10-CM | POA: Insufficient documentation

## 2023-12-09 DIAGNOSIS — I77810 Thoracic aortic ectasia: Secondary | ICD-10-CM | POA: Insufficient documentation

## 2024-02-03 ENCOUNTER — Other Ambulatory Visit: Payer: Self-pay | Admitting: Medical Genetics

## 2024-02-03 DIAGNOSIS — Z006 Encounter for examination for normal comparison and control in clinical research program: Secondary | ICD-10-CM

## 2024-03-05 LAB — GENECONNECT MOLECULAR SCREEN: Genetic Analysis Overall Interpretation: NEGATIVE

## 2024-12-11 ENCOUNTER — Ambulatory Visit (HOSPITAL_COMMUNITY)
